# Patient Record
Sex: Female | Born: 2000 | Race: Black or African American | Hispanic: No | Marital: Single | Smoking: Never smoker
Health system: Southern US, Community
[De-identification: ages and names within clinical notes are randomized; demographics above are authoritative.]

## PROBLEM LIST (undated history)

## (undated) DIAGNOSIS — F319 Bipolar disorder, unspecified: Secondary | ICD-10-CM

---
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
## 2019-01-05 DIAGNOSIS — R69 Illness, unspecified: Secondary | ICD-10-CM | POA: Diagnosis not present

## 2019-01-05 DIAGNOSIS — Z01419 Encounter for gynecological examination (general) (routine) without abnormal findings: Secondary | ICD-10-CM | POA: Diagnosis not present

## 2019-02-01 DIAGNOSIS — F12951 Cannabis use, unspecified with psychotic disorder with hallucinations: Secondary | ICD-10-CM | POA: Diagnosis not present

## 2019-02-01 DIAGNOSIS — R69 Illness, unspecified: Secondary | ICD-10-CM | POA: Diagnosis not present

## 2019-02-09 DIAGNOSIS — R69 Illness, unspecified: Secondary | ICD-10-CM | POA: Diagnosis not present

## 2019-02-09 DIAGNOSIS — F411 Generalized anxiety disorder: Secondary | ICD-10-CM | POA: Diagnosis not present

## 2019-02-12 DIAGNOSIS — M25571 Pain in right ankle and joints of right foot: Secondary | ICD-10-CM | POA: Diagnosis not present

## 2019-02-14 DIAGNOSIS — F418 Other specified anxiety disorders: Secondary | ICD-10-CM | POA: Diagnosis not present

## 2019-02-14 DIAGNOSIS — R69 Illness, unspecified: Secondary | ICD-10-CM | POA: Diagnosis not present

## 2019-02-22 DIAGNOSIS — R69 Illness, unspecified: Secondary | ICD-10-CM | POA: Diagnosis not present

## 2019-04-06 ENCOUNTER — Ambulatory Visit (INDEPENDENT_AMBULATORY_CARE_PROVIDER_SITE_OTHER): Payer: No Typology Code available for payment source | Admitting: Pediatrics

## 2019-04-06 ENCOUNTER — Other Ambulatory Visit: Payer: Self-pay

## 2019-04-06 DIAGNOSIS — M25471 Effusion, right ankle: Secondary | ICD-10-CM

## 2019-04-06 NOTE — Progress Notes (Signed)
Virtual Visit via Video Note  I connected with Sarah Wade on 04/06/19 at  3:50 PM EST by a video enabled telemedicine application and verified that I am speaking with the correct person using two identifiers.   Location of patient/parent: home   I discussed the limitations of evaluation and management by telemedicine and the availability of in person appointments.  I discussed that the purpose of this telehealth visit is to provide medical care while limiting exposure to the novel coronavirus.  The patient expressed understanding and agreed to proceed.  Reason for visit: ankle pain  History of Present Illness: Patient reports that her right ankle has been swelling on and off for a few months. Started in August. She went on a hike and she believes she walked too long. She does not exactly remember rolling or any specific trauma. Last month or October she saw an video doctor who told her to elevate, ice and use ibuprofen which she was able to do for a week. She is still having trouble however. She is just used to it being swollen now and she notices it when she is in pain. She only has pain when she is walking and is able to continue to walk through the pain. No point tenderness, but hurts more on the medial side of her ankle and foot.  Has been wearing a compression wrap at night that helps. She is on her feet most days working at [employer]. Never bruised, no redness. No trouble with chest pain or shortness of breath.    Observations/Objective:  General: well-appearing, walking around home Respiratory: normal work of breathing MSK: moves 4 extremities equally Feet/Ankle: R ankle with nonpitting edema. No bruising or redness noted. Full range of motion of BL ankles. Derm: no rashes appreciated Psych: Appropriate affect  Assessment and Plan:  Right Ankle swelling: No known trauma. Since August. No indication for imaging per [facility] rules, and no point tenderness on exam. 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 Reassured patient and recommended continuing RICE therapy. Also recommended she wear compression stockings while at work and may use ibuprofen 800mg  q6 prn for inflammation and pain. Discussed return precautions including development of point tenderness, inability to walk on foot, etc.   Follow Up Instructions: prn   I discussed the assessment and treatment plan with the patient and/or parent/guardian. They were provided an opportunity to ask questions and all were answered. They agreed with the plan and demonstrated an understanding of the instructions.   They were advised to call back or seek an in-person evaluation in the emergency room if the symptoms worsen or if the condition fails to improve as anticipated.  I spent 16 minutes on this telehealth visit inclusive of face-to-face video and care coordination time I was located at [facility] during this encounter.  [place] Marilou Barnfield, DO

## 2019-04-10 ENCOUNTER — Ambulatory Visit: Payer: Managed Care, Other (non HMO) | Attending: Internal Medicine

## 2019-04-10 ENCOUNTER — Other Ambulatory Visit: Payer: Self-pay

## 2019-04-10 DIAGNOSIS — Z20828 Contact with and (suspected) exposure to other viral communicable diseases: Secondary | ICD-10-CM | POA: Diagnosis not present

## 2019-04-10 DIAGNOSIS — Z20822 Contact with and (suspected) exposure to covid-19: Secondary | ICD-10-CM

## 2019-04-11 LAB — NOVEL CORONAVIRUS, NAA: SARS-CoV-2, NAA: NOT DETECTED

## 2019-04-13 DIAGNOSIS — R69 Illness, unspecified: Secondary | ICD-10-CM | POA: Diagnosis not present

## 2019-04-13 DIAGNOSIS — F419 Anxiety disorder, unspecified: Secondary | ICD-10-CM | POA: Diagnosis not present

## 2019-05-27 ENCOUNTER — Ambulatory Visit: Payer: Self-pay | Attending: Internal Medicine

## 2019-05-28 ENCOUNTER — Ambulatory Visit: Payer: No Typology Code available for payment source | Attending: Internal Medicine

## 2019-05-28 DIAGNOSIS — Z20822 Contact with and (suspected) exposure to covid-19: Secondary | ICD-10-CM | POA: Insufficient documentation

## 2019-05-29 LAB — NOVEL CORONAVIRUS, NAA: SARS-CoV-2, NAA: NOT DETECTED

## 2019-06-17 ENCOUNTER — Other Ambulatory Visit: Payer: Self-pay

## 2019-06-25 ENCOUNTER — Ambulatory Visit: Payer: No Typology Code available for payment source | Attending: Internal Medicine

## 2019-06-25 DIAGNOSIS — Z20822 Contact with and (suspected) exposure to covid-19: Secondary | ICD-10-CM | POA: Insufficient documentation

## 2019-06-26 LAB — NOVEL CORONAVIRUS, NAA: SARS-CoV-2, NAA: NOT DETECTED

## 2019-07-08 DIAGNOSIS — R69 Illness, unspecified: Secondary | ICD-10-CM | POA: Diagnosis not present

## 2019-07-18 ENCOUNTER — Ambulatory Visit: Payer: Self-pay

## 2019-08-11 DIAGNOSIS — N925 Other specified irregular menstruation: Secondary | ICD-10-CM | POA: Diagnosis not present

## 2019-08-11 DIAGNOSIS — R69 Illness, unspecified: Secondary | ICD-10-CM | POA: Diagnosis not present

## 2019-08-11 DIAGNOSIS — B373 Candidiasis of vulva and vagina: Secondary | ICD-10-CM | POA: Diagnosis not present

## 2019-08-11 DIAGNOSIS — Z3202 Encounter for pregnancy test, result negative: Secondary | ICD-10-CM | POA: Diagnosis not present

## 2019-08-14 DIAGNOSIS — R69 Illness, unspecified: Secondary | ICD-10-CM | POA: Diagnosis not present

## 2019-08-14 DIAGNOSIS — F332 Major depressive disorder, recurrent severe without psychotic features: Secondary | ICD-10-CM | POA: Diagnosis not present

## 2019-09-02 ENCOUNTER — Ambulatory Visit: Payer: Self-pay | Attending: Internal Medicine

## 2019-09-02 DIAGNOSIS — Z20822 Contact with and (suspected) exposure to covid-19: Secondary | ICD-10-CM

## 2019-09-03 LAB — SARS-COV-2, NAA 2 DAY TAT

## 2019-09-03 LAB — NOVEL CORONAVIRUS, NAA: SARS-CoV-2, NAA: NOT DETECTED

## 2019-09-14 ENCOUNTER — Ambulatory Visit: Payer: Self-pay | Attending: Internal Medicine

## 2019-09-14 DIAGNOSIS — Z20822 Contact with and (suspected) exposure to covid-19: Secondary | ICD-10-CM

## 2019-09-15 DIAGNOSIS — R69 Illness, unspecified: Secondary | ICD-10-CM | POA: Diagnosis not present

## 2019-09-15 LAB — SARS-COV-2, NAA 2 DAY TAT

## 2019-09-15 LAB — NOVEL CORONAVIRUS, NAA: SARS-CoV-2, NAA: NOT DETECTED

## 2019-09-24 DIAGNOSIS — R2242 Localized swelling, mass and lump, left lower limb: Secondary | ICD-10-CM | POA: Diagnosis not present

## 2019-09-25 ENCOUNTER — Other Ambulatory Visit: Payer: Self-pay | Admitting: Family Medicine

## 2019-09-25 DIAGNOSIS — R2242 Localized swelling, mass and lump, left lower limb: Secondary | ICD-10-CM

## 2019-09-29 ENCOUNTER — Ambulatory Visit
Admission: RE | Admit: 2019-09-29 | Discharge: 2019-09-29 | Disposition: A | Discharge status: Home / Self Care | Payer: No Typology Code available for payment source | Source: Ambulatory Visit | Attending: Family Medicine | Admitting: Family Medicine

## 2019-09-29 DIAGNOSIS — R2242 Localized swelling, mass and lump, left lower limb: Secondary | ICD-10-CM | POA: Diagnosis not present

## 2019-10-01 ENCOUNTER — Ambulatory Visit: Payer: No Typology Code available for payment source | Attending: Internal Medicine

## 2019-10-01 DIAGNOSIS — Z20822 Contact with and (suspected) exposure to covid-19: Secondary | ICD-10-CM | POA: Diagnosis not present

## 2019-10-02 LAB — SARS-COV-2, NAA 2 DAY TAT

## 2019-10-02 LAB — NOVEL CORONAVIRUS, NAA: SARS-CoV-2, NAA: NOT DETECTED

## 2019-11-25 DIAGNOSIS — R69 Illness, unspecified: Secondary | ICD-10-CM | POA: Diagnosis not present

## 2019-11-25 DIAGNOSIS — B373 Candidiasis of vulva and vagina: Secondary | ICD-10-CM | POA: Diagnosis not present

## 2019-11-25 DIAGNOSIS — Z3202 Encounter for pregnancy test, result negative: Secondary | ICD-10-CM | POA: Diagnosis not present

## 2019-11-25 DIAGNOSIS — N925 Other specified irregular menstruation: Secondary | ICD-10-CM | POA: Diagnosis not present

## 2020-01-01 DIAGNOSIS — B373 Candidiasis of vulva and vagina: Secondary | ICD-10-CM | POA: Diagnosis not present

## 2020-02-18 DIAGNOSIS — R69 Illness, unspecified: Secondary | ICD-10-CM | POA: Diagnosis not present

## 2020-02-22 DIAGNOSIS — R69 Illness, unspecified: Secondary | ICD-10-CM | POA: Diagnosis not present

## 2020-03-01 DIAGNOSIS — R69 Illness, unspecified: Secondary | ICD-10-CM | POA: Diagnosis not present

## 2020-03-04 ENCOUNTER — Ambulatory Visit (HOSPITAL_COMMUNITY)
Admission: RE | Admit: 2020-03-04 | Discharge: 2020-03-04 | Disposition: A | Discharge status: Home / Self Care | Payer: No Typology Code available for payment source | Attending: Psychiatry | Admitting: Psychiatry

## 2020-03-04 DIAGNOSIS — R69 Illness, unspecified: Secondary | ICD-10-CM | POA: Diagnosis not present

## 2020-03-04 DIAGNOSIS — Z1330 Encounter for screening examination for mental health and behavioral disorders, unspecified: Secondary | ICD-10-CM | POA: Insufficient documentation

## 2020-03-04 DIAGNOSIS — N760 Acute vaginitis: Secondary | ICD-10-CM | POA: Diagnosis not present

## 2020-03-04 DIAGNOSIS — N926 Irregular menstruation, unspecified: Secondary | ICD-10-CM | POA: Diagnosis not present
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
## 2020-03-04 NOTE — H&P (Signed)
Behavioral Health Medical Screening Exam  Sarah Wade is an 19 y.o. female who presents to [facility] alone as walk-in for "thoughts". Patient states, "I have a lot of thoughts. I'm in therapy and I have been for a while". When asked about thought content patient replied, "having thoughts I don't want to have. Racing thoughts". Patient presents withdrawn and restricted; fair eye contact. Provider and TTS spent extensive time asking both open and closed ended questions to obtain information needed for assessment; patient not fully engaged, does not appear to be thought blocking. Patient denies any suicidal or homicidal thoughts; does endorse passive thoughts of "death". Denies any past suicidal attempts. Denies any changes in appetite or significant weight loss; states she "notices" she "doesn't have an appetite when sad". Patient later states she did have some "thoughts" of suicide but denied any means or plan. Patient states she stays up until 2-3 am then sleeps all day getting around 7 hours of sleep. "I'm pretty healthy and make sure I get good sleep. I know that's important". States she currently lives with mom and sister; denies any bond with either. Says she is currently in a self-paced yoga training and plans to attend community college in the spring. 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 Patient denies having any close friends or relationships; states she is currently on several dating app sites. Hobbies include exercise.   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 Patient endorses a history of psychosis, anxiety, depression. Patient endorses religious trauma and refused to specify. Patient states she is currently in therapy and recently had 2nd session; states she "doesn't feel bond"  with therapist; stating "she uses a lot of terms related to religion and I have a lot of religious trauma". Patient state she is interested in finding a new therapist. Preferences include: atheist, gender neutral, and any race.   Family history includes maternal aunt with ADHD, bipolar, and x1 hospitalization; mom-depression.   Provider asked patient for information of family or friend that could provide collateral information. Patient provided her mother's information then asked "Are you going to call her?"; when provider explained the need to obtain collateral information for safety planning patient responded "oh no, I don't want you to call her". Patient unwilling to provide any other contact. Provider and TTS discussed observation, inpatient, and outpatient services. Patient stated, "you can't just prescribe me medicine? I thought I could come here for medicine". Patient did not feel she needed inpatient and expressed interest in medication management services and outpatient therapy with new therapist. Information on outpatient resources provided. Provider discussed safety with patient and if she felt she was able to keep herself safe until seen by provider and patient replied "yes. I drove myself here".   Patient is currently denying any active suicidal/homicdal ideations, auditory/visual hallucinations, and does not appear to be responding to any external/internal stimulation at this time. Patient is calm and is not actively psychotic. Provider discussed with patient if she experiences any changes to return to [facility] or any local emergency room and she can always call 911. Patient verbalized an understanding and was discharged with outpatient resources.   Total Time spent with patient: 20 minutes  Psychiatric Specialty Exam: Physical Exam Psychiatric:        Attention and Perception: Attention normal.  Mood and Affect: Mood is depressed.        Behavior: Behavior is withdrawn. Behavior is  cooperative.        Cognition and Memory: Cognition and memory normal.        Judgment: Judgment is impulsive.    Review of Systems There were no vitals taken for this visit.There is no height or weight on file to calculate BMI. General Appearance: Disheveled Eye Contact:  Fair Speech:  Clear and Coherent Volume:  Decreased Mood:  Depressed Affect:  Constricted Thought Process:  Coherent Orientation:  Full (Time, Place, and Person) Thought Content:  Logical Suicidal Thoughts:  Yes.  without intent/plan; endorses passive thoughts of death Homicidal Thoughts:  No Memory:  Immediate;   Fair Recent;   Fair Remote;   Fair Judgement:  Fair Insight:  Present Psychomotor Activity:  Normal Concentration: Concentration: Fair and Attention Span: Fair Recall:  [employer] of Knowledge:Fair Language: Fair Akathisia:  NA Handed:  Right AIMS (if indicated):    Assets:  Communication Skills Desire for Improvement Housing Physical Health Resilience Social Support Transportation Sleep:     Musculoskeletal: Strength & Muscle Tone: within normal limits Gait & Station: normal Patient leans: N/A  There were no vitals taken for this visit.  Recommendations: Based on my evaluation the patient does not appear to have an emergency medical condition.  Patient is denying any active suicidal/homicidal ideations, auditory/visual hallucinations, and does not appear to be responding to any external/internal stimuli. Patient states goal of visit was to "get some help with medications". Patient provided with outpatient resources to follow up for medication management by TTS.   Loletta Parish, NP 03/04/2020, 7:54 PM

## 2020-05-03 DIAGNOSIS — F39 Unspecified mood [affective] disorder: Secondary | ICD-10-CM | POA: Diagnosis not present

## 2020-05-03 DIAGNOSIS — R69 Illness, unspecified: Secondary | ICD-10-CM | POA: Diagnosis not present

## 2020-05-03 DIAGNOSIS — F419 Anxiety disorder, unspecified: Secondary | ICD-10-CM | POA: Diagnosis not present

## 2020-06-06 DIAGNOSIS — R69 Illness, unspecified: Secondary | ICD-10-CM | POA: Diagnosis not present

## 2020-06-15 DIAGNOSIS — R69 Illness, unspecified: Secondary | ICD-10-CM | POA: Diagnosis not present

## 2020-07-05 DIAGNOSIS — R69 Illness, unspecified: Secondary | ICD-10-CM | POA: Diagnosis not present

## 2020-07-21 DIAGNOSIS — R69 Illness, unspecified: Secondary | ICD-10-CM | POA: Diagnosis not present

## 2020-09-14 DIAGNOSIS — R69 Illness, unspecified: Secondary | ICD-10-CM | POA: Diagnosis not present

## 2020-09-14 DIAGNOSIS — F428 Other obsessive-compulsive disorder: Secondary | ICD-10-CM | POA: Diagnosis not present

## 2020-09-28 DIAGNOSIS — R69 Illness, unspecified: Secondary | ICD-10-CM | POA: Diagnosis not present

## 2020-09-28 DIAGNOSIS — F428 Other obsessive-compulsive disorder: Secondary | ICD-10-CM | POA: Diagnosis not present

## 2020-10-12 DIAGNOSIS — R69 Illness, unspecified: Secondary | ICD-10-CM | POA: Diagnosis not present

## 2020-10-12 DIAGNOSIS — F428 Other obsessive-compulsive disorder: Secondary | ICD-10-CM | POA: Diagnosis not present

## 2020-10-29 DIAGNOSIS — R69 Illness, unspecified: Secondary | ICD-10-CM | POA: Diagnosis not present

## 2020-10-29 DIAGNOSIS — F428 Other obsessive-compulsive disorder: Secondary | ICD-10-CM | POA: Diagnosis not present

## 2020-11-08 IMAGING — US US EXTREM LOW*L* LIMITED
1 series · 12 of 12 positions shown · non-contrast
Comparison: None.

CLINICAL DATA: Palpable subcutaneous anterior left hip mass.

EXAM:
ULTRASOUND LEFT LOWER EXTREMITY LIMITED
TECHNIQUE: Ultrasound examination of the lower extremity soft tissues was
performed in the area of clinical concern.

[Series 1: us extrem low*left* limited · 0.06mm/px · 12 acquisitions, 12 frames shown]
[im 1/12]
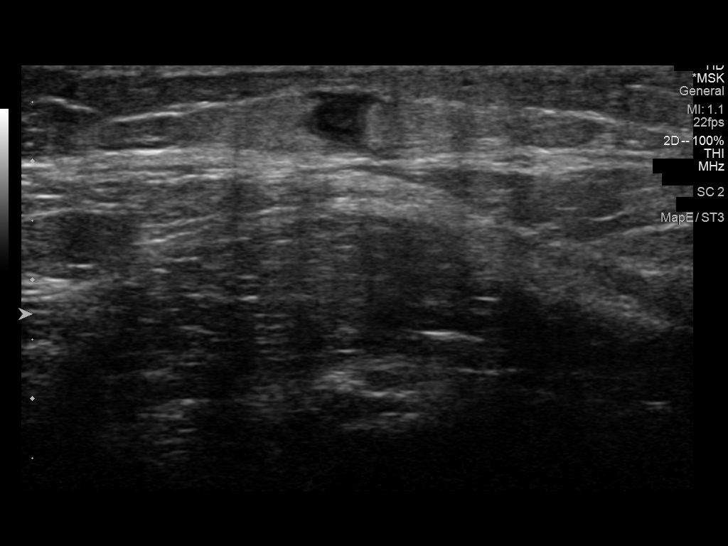
[im 2/12]
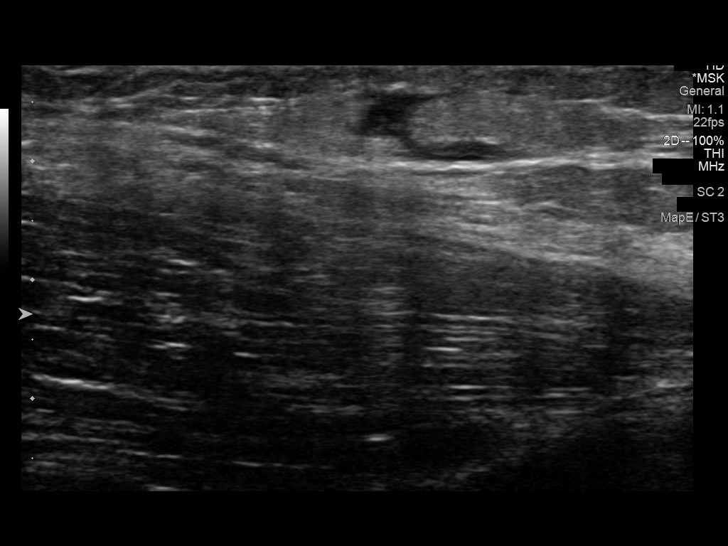
[im 3/12]
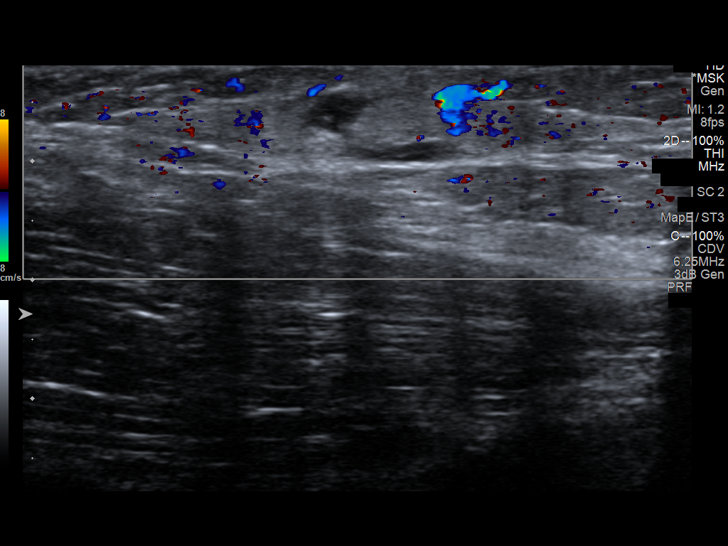
[im 4/12]
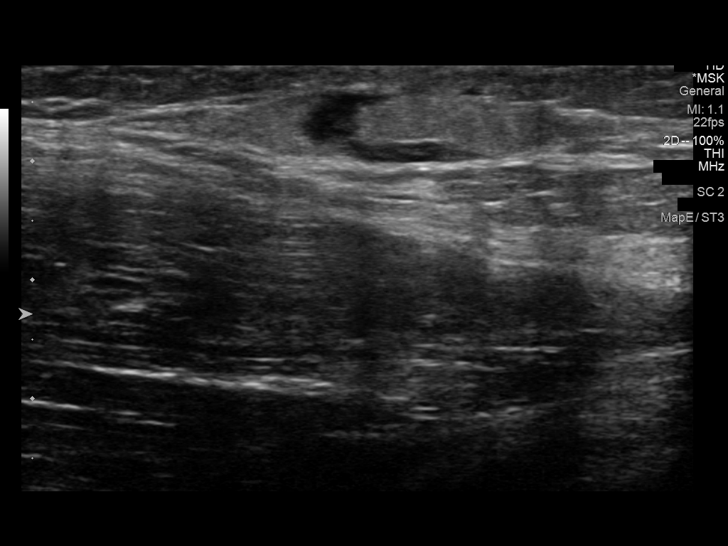
[im 5/12]
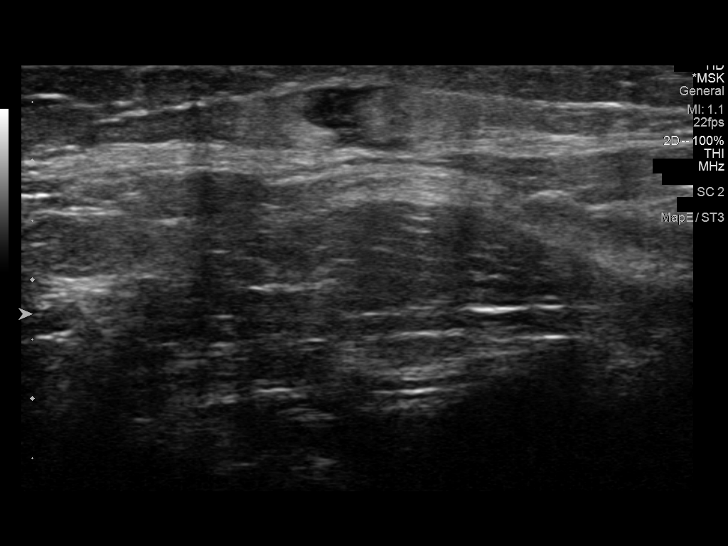
[im 6/12]
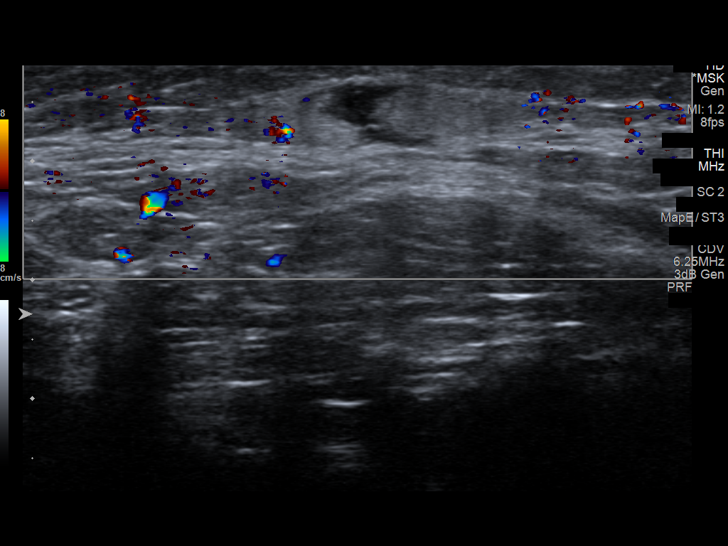
[im 7/12]
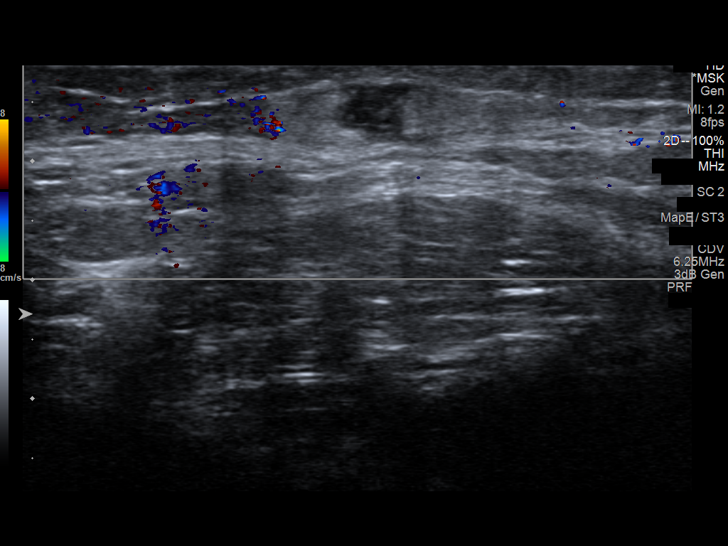
[im 8/12]
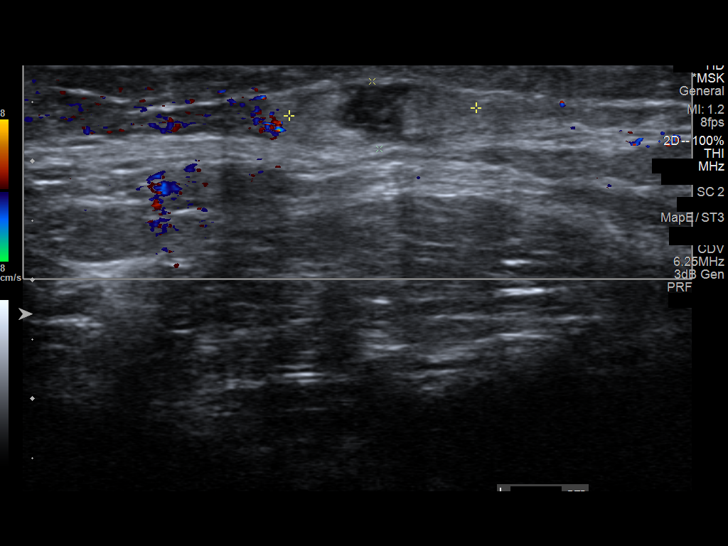
[im 9/12]
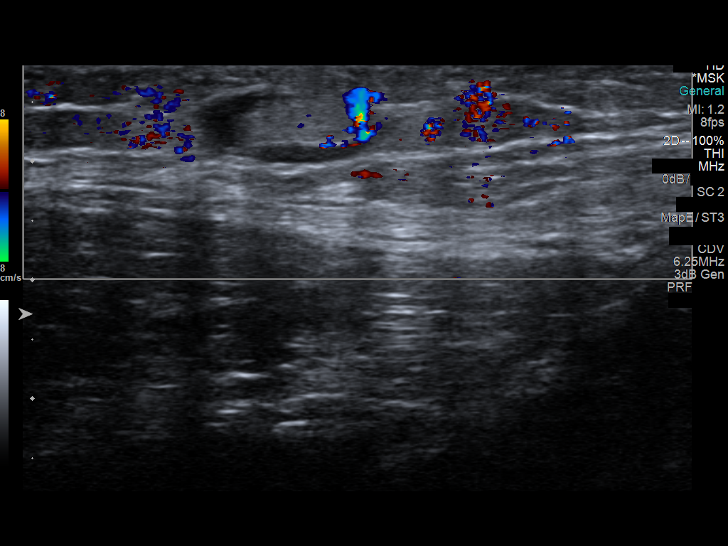
[im 10/12]
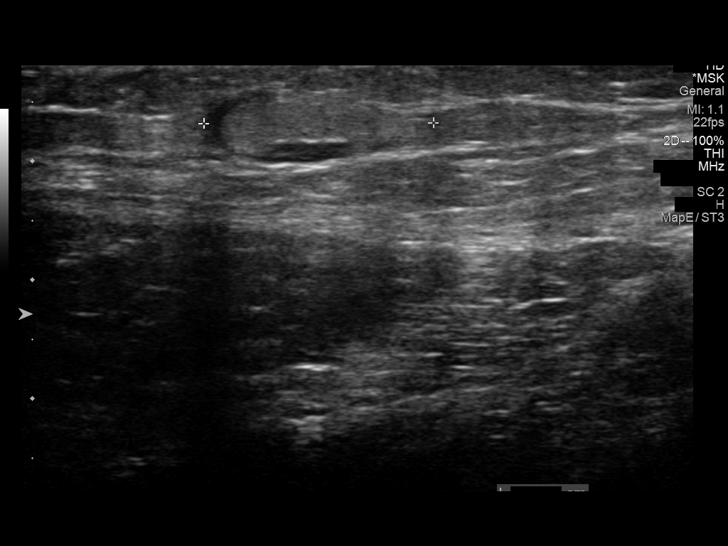
[im 11/12]
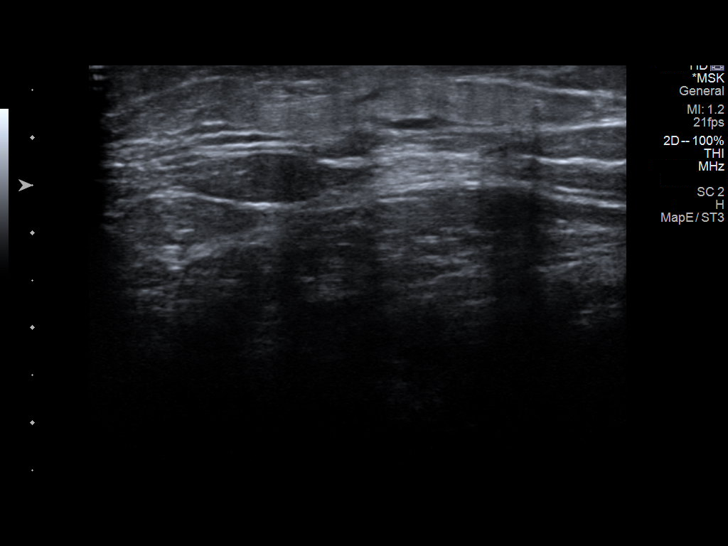
[im 12/12]
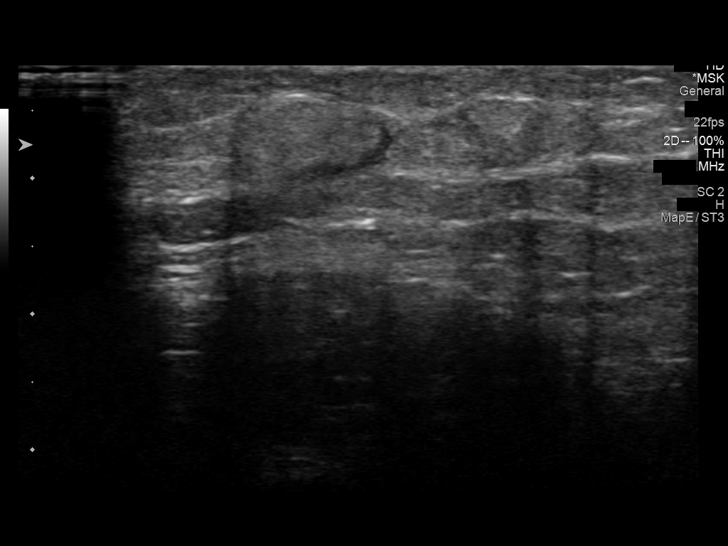

[12 of 12 positions shown; findings below may reference images not displayed]

FINDINGS: At the area of palpable concern as indicated by the patient, there
is a subcutaneous 1.9 x 0.6 x 1.6 cm lymph node with preserved fatty
hilum, thin cortex (2 mm) and normal wider than tall configuration,
with no cystic spaces or hypervascularity. No additional findings.
IMPRESSION: Subcutaneous 1.9 x 0.6 x 1.6 cm lymph node at the area of palpable
concern as indicated by the patient in the anterior left hip,
without suspicious sonographic features. Clinical follow-up advised.
If this mass enlarges clinically, ultrasound-guided biopsy could be
considered.

## 2020-11-09 DIAGNOSIS — R69 Illness, unspecified: Secondary | ICD-10-CM | POA: Diagnosis not present

## 2020-11-09 DIAGNOSIS — F428 Other obsessive-compulsive disorder: Secondary | ICD-10-CM | POA: Diagnosis not present

## 2021-02-06 ENCOUNTER — Encounter (HOSPITAL_COMMUNITY): Payer: Self-pay | Admitting: Registered Nurse

## 2021-02-06 ENCOUNTER — Ambulatory Visit (HOSPITAL_COMMUNITY)
Admission: EM | Admit: 2021-02-06 | Discharge: 2021-02-08 | Disposition: A | Discharge status: Other Acute Inpatient Hospital | Payer: No Typology Code available for payment source | Attending: Registered Nurse | Admitting: Registered Nurse

## 2021-02-06 ENCOUNTER — Other Ambulatory Visit: Payer: Self-pay

## 2021-02-06 DIAGNOSIS — Z886 Allergy status to analgesic agent status: Secondary | ICD-10-CM | POA: Insufficient documentation

## 2021-02-06 DIAGNOSIS — Z20822 Contact with and (suspected) exposure to covid-19: Secondary | ICD-10-CM | POA: Diagnosis not present

## 2021-02-06 DIAGNOSIS — R69 Illness, unspecified: Secondary | ICD-10-CM | POA: Diagnosis not present

## 2021-02-06 DIAGNOSIS — F4323 Adjustment disorder with mixed anxiety and depressed mood: Secondary | ICD-10-CM | POA: Diagnosis present

## 2021-02-06 DIAGNOSIS — F23 Brief psychotic disorder: Secondary | ICD-10-CM | POA: Diagnosis not present

## 2021-02-06 LAB — LIPID PANEL
Cholesterol: 199 mg/dL (ref 0–200)
HDL: 83 mg/dL (ref >40)
LDL Cholesterol: 110 mg/dL — ABNORMAL HIGH (ref 0–99)
Total CHOL/HDL Ratio: 2.4 RATIO
Triglycerides: 30 mg/dL (ref <150)
VLDL: 6 mg/dL (ref 0–40)

## 2021-02-06 LAB — URINALYSIS, ROUTINE W REFLEX MICROSCOPIC
Bacteria, UA: NONE SEEN
Bilirubin Urine: NEGATIVE
Glucose, UA: NEGATIVE mg/dL
Ketones, ur: 20 mg/dL — AB
Leukocytes,Ua: NEGATIVE
Nitrite: NEGATIVE
Protein, ur: NEGATIVE mg/dL
Specific Gravity, Urine: 1.003 — ABNORMAL LOW (ref 1.005–1.030)
pH: 5 (ref 5.0–8.0)

## 2021-02-06 LAB — COMPREHENSIVE METABOLIC PANEL
ALT: 17 U/L (ref 0–44)
AST: 24 U/L (ref 15–41)
Albumin: 4.6 g/dL (ref 3.5–5.0)
Alkaline Phosphatase: 60 U/L (ref 38–126)
Anion gap: 15 (ref 5–15)
BUN: 9 mg/dL (ref 6–20)
CO2: 19 mmol/L — ABNORMAL LOW (ref 22–32)
Calcium: 9.5 mg/dL (ref 8.9–10.3)
Chloride: 102 mmol/L (ref 98–111)
Creatinine, Ser: 0.91 mg/dL (ref 0.44–1.00)
GFR, Estimated: >60 mL/min (ref >60)
Glucose, Bld: 85 mg/dL (ref 70–99)
Potassium: 3.5 mmol/L (ref 3.5–5.1)
Sodium: 136 mmol/L (ref 135–145)
Total Bilirubin: 2 mg/dL — ABNORMAL HIGH (ref 0.3–1.2)
Total Protein: 7.5 g/dL (ref 6.5–8.1)

## 2021-02-06 LAB — PREGNANCY, URINE: Preg Test, Ur: NEGATIVE

## 2021-02-06 LAB — CBC WITH DIFFERENTIAL/PLATELET
Abs Immature Granulocytes: 0.04 10*3/uL (ref 0.00–0.07)
Basophils Absolute: 0.1 10*3/uL (ref 0.0–0.1)
Basophils Relative: 1 %
Eosinophils Absolute: 0 10*3/uL (ref 0.0–0.5)
Eosinophils Relative: 0 %
HCT: 40.6 % (ref 36.0–46.0)
Hemoglobin: 13.8 g/dL (ref 12.0–15.0)
Immature Granulocytes: 0 %
Lymphocytes Relative: 16 %
Lymphs Abs: 1.8 10*3/uL (ref 0.7–4.0)
MCH: 29.2 pg (ref 26.0–34.0)
MCHC: 34 g/dL (ref 30.0–36.0)
MCV: 85.8 fL (ref 80.0–100.0)
Monocytes Absolute: 0.9 10*3/uL (ref 0.1–1.0)
Monocytes Relative: 8 %
Neutro Abs: 8.9 10*3/uL — ABNORMAL HIGH (ref 1.7–7.7)
Neutrophils Relative %: 75 %
Platelets: 232 10*3/uL (ref 150–400)
RBC: 4.73 MIL/uL (ref 3.87–5.11)
RDW: 13.4 % (ref 11.5–15.5)
WBC: 11.7 10*3/uL — ABNORMAL HIGH (ref 4.0–10.5)
nRBC: 0 % (ref 0.0–0.2)

## 2021-02-06 LAB — POCT URINE DRUG SCREEN - MANUAL ENTRY (I-SCREEN)
POC Amphetamine UR: NOT DETECTED
POC Buprenorphine (BUP): NOT DETECTED
POC Cocaine UR: NOT DETECTED
POC Marijuana UR: NOT DETECTED
POC Methadone UR: NOT DETECTED
POC Methamphetamine UR: NOT DETECTED
POC Morphine: NOT DETECTED
POC Oxazepam (BZO): NOT DETECTED
POC Oxycodone UR: NOT DETECTED
POC Secobarbital (BAR): NOT DETECTED

## 2021-02-06 LAB — TSH: TSH: 1.7 u[IU]/mL (ref 0.350–4.500)

## 2021-02-06 LAB — RESP PANEL BY RT-PCR (FLU A&B, COVID) ARPGX2
Influenza A by PCR: NEGATIVE
Influenza B by PCR: NEGATIVE
SARS Coronavirus 2 by RT PCR: NEGATIVE

## 2021-02-06 LAB — MAGNESIUM: Magnesium: 2 mg/dL (ref 1.7–2.4)

## 2021-02-06 LAB — POC SARS CORONAVIRUS 2 AG -  ED: SARS Coronavirus 2 Ag: NEGATIVE

## 2021-02-06 LAB — HEMOGLOBIN A1C
Hgb A1c MFr Bld: 5.1 % (ref 4.8–5.6)
Mean Plasma Glucose: 99.67 mg/dL

## 2021-02-06 LAB — POCT PREGNANCY, URINE: Preg Test, Ur: NEGATIVE

## 2021-02-06 LAB — ETHANOL: Alcohol, Ethyl (B): <10 mg/dL (ref <10)

## 2021-02-06 MED ORDER — HALOPERIDOL 5 MG PO TABS: 5.0000 mg | ORAL_TABLET | Freq: Once | ORAL | Status: AC | PRN

## 2021-02-06 MED ORDER — LORAZEPAM 2 MG/ML IJ SOLN
INTRAMUSCULAR | Status: AC
  Administered 2021-02-06: 1 mg via INTRAMUSCULAR
  Filled 2021-02-06: qty 1

## 2021-02-06 MED ORDER — HALOPERIDOL LACTATE 5 MG/ML IJ SOLN
5.0000 mg | Freq: Once | INTRAMUSCULAR | Status: AC | PRN
  Administered 2021-02-06: 5 mg via INTRAMUSCULAR
  Filled 2021-02-06: qty 1

## 2021-02-06 MED ORDER — OLANZAPINE 5 MG PO TBDP
5.0000 mg | ORAL_TABLET | Freq: Every day | ORAL | Status: DC
  Administered 2021-02-07: 5 mg via ORAL
  Filled 2021-02-06: qty 1

## 2021-02-06 MED ORDER — LORAZEPAM 1 MG PO TABS: 1.0000 mg | ORAL_TABLET | Freq: Once | ORAL | Status: AC | PRN

## 2021-02-06 MED ORDER — TRAZODONE HCL 50 MG PO TABS
50.0000 mg | ORAL_TABLET | Freq: Every evening | ORAL | Status: DC | PRN
  Administered 2021-02-07: 50 mg via ORAL
  Filled 2021-02-06: qty 1

## 2021-02-06 MED ORDER — ALUM & MAG HYDROXIDE-SIMETH 200-200-20 MG/5ML PO SUSP
30.0000 mL | ORAL | Status: DC | PRN
Start: 2021-02-06 — End: 2021-02-08

## 2021-02-06 MED ORDER — DIPHENHYDRAMINE HCL 25 MG PO CAPS: 25.0000 mg | ORAL_CAPSULE | Freq: Once | ORAL | Status: AC | PRN

## 2021-02-06 MED ORDER — MAGNESIUM HYDROXIDE 400 MG/5ML PO SUSP: 30.0000 mL | Freq: Every day | ORAL | Status: DC | PRN

## 2021-02-06 MED ORDER — LORAZEPAM 2 MG/ML IJ SOLN: 1.0000 mg | Freq: Once | INTRAMUSCULAR | Status: AC | PRN

## 2021-02-06 MED ORDER — DIPHENHYDRAMINE HCL 50 MG/ML IJ SOLN
25.0000 mg | Freq: Once | INTRAMUSCULAR | Status: AC | PRN
  Administered 2021-02-06: 25 mg via INTRAVENOUS
  Filled 2021-02-06: qty 1

## 2021-02-06 MED ORDER — HYDROXYZINE HCL 25 MG PO TABS
25.0000 mg | ORAL_TABLET | Freq: Three times a day (TID) | ORAL | Status: DC | PRN
  Administered 2021-02-07: 25 mg via ORAL
  Filled 2021-02-06: qty 1

## 2021-02-06 NOTE — ED Notes (Signed)
Pt calm and cooperative. Breathing even and unlabored. Will continue to monitor for safety 

## 2021-02-06 NOTE — ED Provider Notes (Addendum)
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  Patient seen face to face by this provider, consulted with Dr. Earlene Plater; and chart reviewed on 02/06/21.  On evaluation Shamanda Len reports she came to urgent care because of depression and not sleeping the last couple of days.  Patient denies suicidal ideation but states"I feel like him going to die but I don't want to die and I don't want to kill myself."  Patient also denies homicidal ideation.  Patient reports she does have paranoia stating she feels like everyone can read her mind or hear her thoughts.  Patient states she has not slept in the last couple of days.  Patient denies illicit drug use.  Reported earlier to TTS that she had been doing CBD daily but at this time she states it has been a while since she has done any illicit drugs and it has been a while since she has used CBD.  Patient reports at some point she was diagnosed with schizoaffective disorder and bipolar 1 disorder but is unable to say who diagnosed her or when she was diagnosed.  Patient's mother is at her side and reports that patient has asked her several times if she was able to hear her thoughts.  Reports patient went outside last night in the rain with 7 pair of pants and 7 shirts on stating she was going to [employer].  "I told her she needed to come in a house and go to sleep and that she can go anywhere until after she is slipped.  She asked me to lay down beside her and rub her back and when I did that I felt how which she was.   I had her go to the bathroom and take all for her wet clothing but she has only slept maybe 10 minutes."  Patient's mother states she is unaware of any prior psychiatric diagnoses.  Patient lives with her mother.  Mother and patient agreed to patient staying tonight for continuous assessment for safety and stabilization.  Discussed starting medication to help with paranoia and sleep. During evaluation Rashidah [place] is alert/oriented x 4; calm/cooperative.  Patient's Wade is anxious and depressed. She does not appear to be responding to internal/external stimuli but there is possible some delusional thinking and paranoia.  Patient feels people can hear her thoughts and read her mind, patient also thinks she may be pregnant.  At this time patient does denies suicidal/self-harm/homicidal ideation and auditory/visual hallucinations.  She does endorse paranoia and possible delusional thinking or thoughts that are not real.  Patient admitted to continuous assessment unit.   PHQ 2-9:  Flowsheet Row Office Visit from [facility]/06/2018 [facility] from 02/19/2017 in [place] [facility]  PHQ-9 Total Score 5 22         Total Time spent with patient: 30 minutes  Musculoskeletal  Strength & Muscle Tone: within normal limits Gait & Station: normal Patient leans: N/A  Psychiatric  Specialty Exam  Presentation General Appearance: Disheveled; Casual  Eye Contact:Good  Speech:Clear and Coherent; Normal Rate  Speech Volume:Normal  Handedness:Right   Wade and Affect  Wade:Anxious; Depressed  Affect:Congruent   Thought Process  Thought Processes:Coherent; Disorganized; Goal Directed  Descriptions of Associations:Circumstantial  Orientation:Full (Time, Place and Person)  Thought Content:WDL    Hallucinations:Hallucinations: None  Ideas of Reference:Paranoia  Suicidal  Thoughts:Suicidal Thoughts: No  Homicidal Thoughts:Homicidal Thoughts: No   Sensorium  Memory:Immediate Fair; Recent Good  Judgment:Fair  Insight:Fair; Present   Executive Functions  Concentration:Fair  Attention Span:Fair  Recall:Fair  Fund of Knowledge:Fair  Language:Good   Psychomotor Activity  Psychomotor Activity:Psychomotor Activity: Normal   Assets  Assets:Communication Skills; Desire for Improvement; Financial Resources/Insurance; Housing; Resilience; Social Support   Sleep  Sleep:Sleep: Poor (States no sleep last couple of days)   Nutritional Assessment (For OBS and [facility] admissions only) Has the patient had a weight loss or gain of 10 pounds or more in the last 3 months?: No Has the patient had a decrease in food intake/or appetite?: No Does the patient have dental problems?: No Does the patient have eating habits or behaviors that may be indicators of an eating disorder including binging or inducing vomiting?: No Has the patient recently lost weight without trying?: 0 Has the patient been eating poorly because of a decreased appetite?: 0 Malnutrition Screening Tool Score: 0   Physical Exam Vitals and nursing note reviewed. Exam conducted with a chaperone present.  Constitutional:      General: She is not in acute distress.    Appearance: Normal appearance. She is not ill-appearing.  Cardiovascular:     Rate and Rhythm: Normal rate.  Pulmonary:     Effort: Pulmonary effort is normal.  Musculoskeletal:        General: Normal range of motion.     Cervical back: Normal range of motion.  Skin:    General: Skin is warm and dry.  Neurological:     Mental Status: She is alert and oriented to person, place, and time.  Psychiatric:        Attention and Perception: Attention and perception normal. She does not perceive auditory or visual hallucinations.        Wade and Affect: Wade is anxious and depressed.        Speech: Speech normal.         Behavior: Behavior normal. Behavior is cooperative.        Thought Content: Thought content is paranoid and delusional. Thought content does not include homicidal or suicidal ideation.        Judgment: Judgment is impulsive.   Review of Systems  Constitutional: Negative.   HENT: Negative.    Eyes: Negative.   Respiratory: Negative.    Cardiovascular: Negative.   Gastrointestinal: Negative.   Genitourinary: Negative.   Musculoskeletal: Negative.   Skin: Negative.   Neurological: Negative.   Endo/Heme/Allergies: Negative.   Psychiatric/Behavioral:  Positive for depression. Negative for suicidal ideas. Hallucinations: Denies auditory/visual hallucinations. Substance abuse: Denies.The patient is nervous/anxious and has insomnia.        States she is having some paranoia that people can hear her thoughts or read her mind.  Also thinks she is pregnant   Blood pressure (!) 150/81, pulse (!) 108, temperature 98.6 F (37 C), temperature source Oral, resp. rate 18, SpO2 100 %. There is no height or weight on file to calculate BMI.  Past Psychiatric History: Adjustment disorder with anxiety and depression, major depressive disorder,  patient reporting a prior history of schizoaffective disorder bipolar type.  Is the patient at risk to self? No  Has the patient been a risk to self in the past 6 months? No .    Has the patient been a risk to self within the distant past? No   Is the patient a risk to others? No   Has the patient been a risk to others in the past 6 months? No   Has the patient been a risk to others within the distant past? No   Past Medical History: History reviewed. No pertinent past medical history. History reviewed. No pertinent surgical history.  Family History: History reviewed. No pertinent family history.  Social History:  Social History   Socioeconomic History   Marital status: Single    Spouse name: Not on file   Number of children: Not on file   Years of  education: Not on file   Highest education level: Not on file  Occupational History   Not on file  Tobacco Use   Smoking status: Never   Smokeless tobacco: Never  Substance and Sexual Activity   Alcohol use: No   Drug use: No   Sexual activity: Not on file  Other Topics Concern   Not on file  Social History Narrative   Not on file   Social Determinants of Health   Financial Resource Strain: Not on file  Food Insecurity: Not on file  Transportation Needs: Not on file  Physical Activity: Not on file  Stress: Not on file  Social Connections: Not on file  Intimate Partner Violence: Not on file    SDOH:  SDOH Screenings   Alcohol Screen: Not on file  Depression (PHQ2-9): Not on file  Financial Resource Strain: Not on file  Food Insecurity: Not on file  Housing: Not on file  Physical Activity: Not on file  Social Connections: Not on file  Stress: Not on file  Tobacco Use: Low Risk    Smoking Tobacco Use: Never   Smokeless Tobacco Use: Never  Transportation Needs: Not on file    Last Labs:  No visits with results within 6 Month(s) from this visit.  Latest known visit with results is:  Lab on 10/01/2019  Component Date Value Ref Range Status   SARS-CoV-2, NAA 10/01/2019 Not Detected  Not Detected Final   Comment: This nucleic acid amplification test was developed and its performance characteristics determined by [employer]. Nucleic acid amplification tests include RT-PCR and TMA. This test has not been FDA cleared or approved. This test has been authorized by FDA under an Emergency Use Authorization (EUA). This test is only authorized for the duration of time the declaration that circumstances exist justifying the authorization of the emergency use of in vitro diagnostic tests for detection of SARS-CoV-2 virus and/or diagnosis of COVID-19 infection under section 564(b)(1) of the Act, 21 U.S.C. 448JEH-6(D) (1), unless the authorization is terminated or  revoked sooner. When diagnostic testing is negative, the possibility of a false negative result should be considered in the context of a patient's recent exposures and the presence of clinical signs and symptoms consistent with COVID-19. An individual without symptoms of COVID-19 and who is not shedding SARS-CoV-2 virus wo                          uld expect to have a negative (not detected) result in this assay.    SARS-CoV-2, NAA 2 DAY TAT 10/01/2019  Performed   Final    Allergies: Tylenol [acetaminophen] and Nickel  PTA Medications: (Not in a hospital admission)   Medical Decision Making  Patient admitted to continuous assessment unit for safety and stabilization.  Lab Orders         Resp Panel by RT-PCR (Flu A&B, Covid) Nasopharyngeal Swab         CBC with Differential/Platelet         Comprehensive metabolic panel         Hemoglobin A1c         Magnesium         Ethanol         Lipid panel         TSH         Urinalysis, Routine w reflex microscopic Urine, Clean Catch         Pregnancy, urine         POC SARS Coronavirus 2 Ag-ED - Nasal Swab         POCT Urine Drug Screen - ([facility])    Meds ordered this encounter  Medications   alum & mag hydroxide-simeth (MAALOX/MYLANTA) 200-200-20 MG/5ML suspension 30 mL   magnesium hydroxide (MILK OF MAGNESIA) suspension 30 mL   hydrOXYzine (ATARAX/VISTARIL) tablet 25 mg   traZODone (DESYREL) tablet 50 mg   OLANZapine zydis (ZYPREXA) disintegrating tablet 5 mg       Recommendations  Based on my evaluation the patient does not appear to have an emergency medical condition.  Shealyn Sean, NP 02/06/21  6:04 PM

## 2021-02-06 NOTE — ED Notes (Signed)
Pt incontinent of urine x 1.  Comfort measures given.  Pt resting at present.  Monitoring for safety.

## 2021-02-06 NOTE — ED Notes (Signed)
Agitation meds given without incident.  Pt resting at present.  No distress noted.  Monitoring for safety.

## 2021-02-06 NOTE — ED Notes (Signed)
Patient just arrived on unit 

## 2021-02-06 NOTE — ED Notes (Signed)
Pt increasingly agitated, pt very intrusive with other pt's in the dept.  Difficult to redirect, disturbing the milieu.  Agitation meds ordered.

## 2021-02-06 NOTE — BH Assessment (Signed)
Sarah Wade: ROUTINE Patient is a 20 year old that presents this date with ongoing depression and anxiety. Patient denies any S/I, H/I or AVH. Patient states she resides with her mother and has in the past been diagnosed in 2020 with depression and anxiety. Patient is observed to be anxious and renders a confusing history. Patient states she uses CBD once or twice a month although denies the use of any other substances. Patient denies any S/I, H/I or AVH. Patient reports she was on Zoloft and Seroquel in 2021 although states she has not been on medications for the last two years. Patient is requesting to be evaluated for possible medication interventions to assist with "feeling hopeless and worthless."  A better history can be obtained from note review, see note of 03/04/20

## 2021-02-06 NOTE — BH Assessment (Signed)
Patient is a 20 year old that presents this date with ongoing depression and anxiety. Patient denies any S/I, H/I or AVH. Patient states she resides with her mother and has in the past been diagnosed in 2020 with depression and anxiety. Patient is observed to be anxious and renders a confusing history. Patient states she uses CBD once or twice a month although denies the use of any other substances. Patient denies any S/I, H/I or AVH. Patient reports she was on Zoloft and Seroquel in 2021 although states she has not been on medications for the last two years. Patient is requesting to be evaluated for possible medication interventions to assist with "feeling hopeless and worthless."  A better history can be obtained from note review, see note of 03/04/20

## 2021-02-06 NOTE — BH Assessment (Signed)
Comprehensive Clinical Assessment (CCA) Note  02/06/2021 Sarah Wade [id number]  DISPOSITION: [facility]in NP recommends observation in continuous assessment.  Flowsheet Row ED from 02/06/2021 in [facility]  C-SSRS RISK CATEGORY No Risk       The patient demonstrates the following risk factors for suicide: Chronic risk factors for suicide include: N/A. Acute risk factors for suicide include: N/A. Protective factors for this patient include: coping skills. Considering these factors, the overall suicide risk at this point appears to be low. Patient is not appropriate for outpatient follow up.   Sarah Wade is an 20 female with history of anxiety and depression that presents as a voluntary walk in to [facility]. Patient denies any S/I although reports "she can't go on." Patient renders limited history and is very disorganized and difficult to redirect. When asked in reference to S/I patient is observed to lay her head down and will not respond to this writer's questions. Patient states "no" when asked in reference to H/I or AVH. Patient states "I just need help" although again renders limited history and is unsure of in reference to what services she is interested in this date. As this attempts to further assess patient is observed to respond with vague answers and as this writer attempts to re-frame questions patient stares at this Clinical research associate. Patient states she does "CBD" when asked in reference to SA issues reporting she uses unknown amounts "once or twice a month." Patient cannot recall last use. 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 Patient has a past history of being prescribed Seroquel and Zoloft for symptom management although states this date she "doesn't remember that." Patient per note from 2021 was seeing a therapist Meredith Leeds although when questioned this date patient  states she cannot recall.    [facility]in NP evaluated patient this date and writes: Sarah Wade, 20 y.o., female patient presents to [facility] as a walk in accompanied by her mother with complaints of paranoia, depression, and not sleeping.  Patient seen face to face by this provider, consulted with Dr. Earlene Plater; and chart reviewed on 02/06/21.  On evaluation Sarah Wade reports she came to urgent care because of depression and not sleeping the last couple of days.  Patient denies suicidal ideation but states"I feel like him going to die but I don't want to die and I don't want to kill myself."  Patient also denies homicidal ideation.  Patient reports she does have paranoia stating she feels like everyone can read her mind or hear her thoughts.  Patient states she has not slept in the last couple of days.  Patient denies illicit drug use.  Reported earlier to TTS that she had been doing CBD daily but at this time she states it has been a while since she has done any illicit drugs and it has been a while since she has used CBD.  Patient reports at some point she was diagnosed with schizoaffective disorder and bipolar 1 disorder but is unable to say who diagnosed her or when she was diagnosed.  Patient's mother is at her side and reports that patient has asked her several times if she was able to hear her thoughts.  Reports patient went outside last night in the rain with 7 pair of pants and 7 shirts on stating she was going to [employer].  "I told her she needed to come in  a house and go to sleep and that she can go anywhere until after she is slipped.  She asked me to lay down beside her and rub her back and when I did that I felt how which she was.  I had her go to the bathroom and take all for her wet clothing but she has only slept maybe 10 minutes."  Patient's mother states she is unaware of any prior psychiatric diagnoses.  Patient lives with her mother.  Mother and patient agreed to patient  staying tonight for continuous assessment for safety and stabilization.  Discussed starting medication to help with paranoia and sleep. During evaluation Lincoln Kleiner is alert/oriented x 4; calm/cooperative.  Patient's Wade is anxious and depressed. She does not appear to be responding to internal/external stimuli but there is possible some delusional thinking and paranoia.  Patient feels people can hear her thoughts and read her mind, patient also thinks she may be pregnant.  At this time patient does denies suicidal/self-harm/homicidal ideation and auditory/visual hallucinations.  She does endorse paranoia and possible delusional thinking or thoughts that are not real.  Patient admitted to continuous assessment unit.    Chief Complaint:  Chief Complaint  Patient presents with   Depression   Anxiety   Visit Diagnosis: Unspecified psychosis     CCA Screening, Triage and Referral (STR)  Patient Reported Information How did you hear about [place]? Self  What Is the Reason for Your Visit/Call Today? Patient presents with ongoing anxiety and depression. Patient is observed to be confused and disorganized.  How Long Has This Been Causing You Problems? <Week  What Do You Feel Would Help You the Most Today? Medication(s)   Have You Recently Had Any Thoughts About Hurting Yourself? No  Are You Planning to Commit Suicide/Harm Yourself At This time? No   Have you Recently Had Thoughts About Hurting Someone Karolee Ohs? No  Are You Planning to Harm Someone at This Time? No  Explanation: No data recorded  Have You Used Any Alcohol or Drugs in the Past 24 Hours? No  How Long Ago Did You Use Drugs or Alcohol? No data recorded What Did You Use and How Much? No data recorded  Do You Currently Have a Therapist/Psychiatrist? No  Name of Therapist/Psychiatrist: No data recorded  Have You Been Recently Discharged From Any Office Practice or Programs? No  Explanation of Discharge From  Practice/Program: No data recorded    CCA Screening Triage Referral Assessment Type of Contact: Face-to-Face  Telemedicine Service Delivery:   Is this Initial or Reassessment? No data recorded Date Telepsych consult ordered in [facility]:   (walk in )  Time Telepsych consult ordered in [facility]:  No data recorded Location of Assessment: [address] [facility] Assessment Services  Provider Location: [facility] Assessment Services   Collateral Involvement: None at this time   Does Patient Have a Automotive engineer Guardian? No data recorded Name and Contact of Legal Guardian: -- (no legal guardian )  If Minor and Not Living with Parent(s), Who has Custody? NA  Is CPS involved or ever been involved? Never  Is APS involved or ever been involved? Never   Patient Determined To Be At Risk for Harm To Self or Others Based on Review of Patient Reported Information or Presenting Complaint? No  Method: No data recorded Availability of Means: No data recorded Intent: No data recorded Notification Required: No data recorded Additional Information for Danger to Others Potential: No data recorded Additional Comments for Danger to Others  Potential: No data recorded Are There Guns or Other Weapons in Your Home? No data recorded Types of Guns/Weapons: No data recorded Are These Weapons Safely Secured?                            No data recorded Who Could Verify You Are Able To Have These Secured: No data recorded Do You Have any Outstanding Charges, Pending Court Dates, Parole/Probation? No data recorded Contacted To Inform of Risk of Harm To Self or Others: Other: Comment (NA)    Does Patient Present under Involuntary Commitment? No  IVC Papers Initial File Date: No data recorded  [place] of Residence: [place]   Patient Currently Receiving the Following Services: Not Receiving Services   Determination of Need: Emergent (2 hours)   Options For Referral: Medication Management     CCA  Biopsychosocial Patient Reported Schizophrenia/Schizoaffective Diagnosis in Past: No   Strengths: Pt is willing to participate in treatment   Mental Health Symptoms Depression:   Change in energy/activity; Difficulty Concentrating   Duration of Depressive symptoms:  Duration of Depressive Symptoms: Greater than two weeks   Mania:   None   Anxiety:    Difficulty concentrating; Irritability   Psychosis:   Other negative symptoms   Duration of Psychotic symptoms:  Duration of Psychotic Symptoms: Less than six months   Trauma:   None   Obsessions:   None   Compulsions:   None   Inattention:   None   Hyperactivity/Impulsivity:   None   Oppositional/Defiant Behaviors:   None   Emotional Irregularity:   Chronic feelings of emptiness   Other Wade/Personality Symptoms:   Not assessed    Mental Status Exam Appearance and self-care  Stature:   Average   Weight:   Average weight   Clothing:   Disheveled   Grooming:   Neglected   Cosmetic use:   None   Posture/gait:   Bizarre   Motor activity:   Agitated   Sensorium  Attention:   Confused   Concentration:   Anxiety interferes   Orientation:   X5   Recall/memory:   Normal   Affect and Wade  Affect:   Anxious   Wade:   Anxious   Relating  Eye contact:   Fleeting   Facial expression:   Anxious; Depressed; Fearful   Attitude toward examiner:   Uninterested; Guarded   Thought and Language  Speech flow:  Articulation error   Thought content:   Suspicious   Preoccupation:   None   Hallucinations:   None   Organization:  No data recorded  [employer] of Knowledge:   Fair   Intelligence:   Average   Abstraction:   Abstract   Judgement:   Fair   Dance movement psychotherapist:   Realistic   Insight:   Fair   Decision Making:   Confused   Social Functioning  Social Maturity:   Responsible   Social Judgement:   Normal   Stress  Stressors:   --  Industrial/product designer)   Coping Ability:   Exhausted   Skill Deficits:   Activities of daily living   Supports:   Usual     Religion: Religion/Spirituality Are You A Religious Person?: No  Leisure/Recreation: Leisure / Recreation Do You Have Hobbies?: No  Exercise/Diet: Exercise/Diet Do You Exercise?: No Have You Gained or Lost A Significant Amount of Weight in the Past Six Months?: No Do You Follow a  Special Diet?: No Do You Have Any Trouble Sleeping?: No   CCA Employment/Education Employment/Work Situation: Employment / Work Situation Employment Situation: Unemployed  Education: Education Did You Have An Individualized Education Program (IIEP): No Did You Have Any Difficulty At [employer]?: No Patient's Education Has Been Impacted by Current Illness: No   CCA Family/Childhood History Family and Relationship History:    Childhood History:     Child/Adolescent Assessment:     CCA Substance Use Alcohol/Drug Use: Alcohol / Drug Use Pain Medications: SEE MAR Prescriptions: SEE MAR Over the Counter: SEE MAR History of alcohol / drug use?: Yes Substance #1 Name of Substance 1: CBD/THC 1 - Age of First Use: UTA 1 - Amount (size/oz): Varies 1 - Frequency: Varies 1 - Duration: Ongoing 1 - Last Use / Amount: UTA                       ASAM's:  Six Dimensions of Multidimensional Assessment  Dimension 1:  Acute Intoxication and/or Withdrawal Potential:   Dimension 1:  Description of individual's past and current experiences of substance use and withdrawal: 1  Dimension 2:  Biomedical Conditions and Complications:   Dimension 2:  Description of patient's biomedical conditions and  complications: 1  Dimension 3:  Emotional, Behavioral, or Cognitive Conditions and Complications:  Dimension 3:  Description of emotional, behavioral, or cognitive conditions and complications: 1  Dimension 4:  Readiness to Change:  Dimension 4:  Description of Readiness to Change  criteria: 2  Dimension 5:  Relapse, Continued use, or Continued Problem Potential:  Dimension 5:  Relapse, continued use, or continued problem potential critiera description: 2  Dimension 6:  Recovery/Living Environment:  Dimension 6:  Recovery/Iiving environment criteria description: 1  ASAM Severity Score: ASAM's Severity Rating Score: 8  ASAM Recommended Level of Treatment:     Substance use Disorder (SUD)    Recommendations for Services/Supports/Treatments:    Discharge Disposition:    DSM5 Diagnoses: Patient Active Problem List   Diagnosis Date Noted   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

## 2021-02-06 NOTE — Progress Notes (Signed)
Received Shareta this PM in the assessment room in her her mom's arms.. She urinated on the floor prior to giving a urine specimen. Mom nor the patient was able to give a rationale for the urine accident. She was unable to give a urine specimen. She was cooperative with the admission process. She is disorganized and mute. The skin assessment was done with mom present. She was given dry clothes after her arrival to the unit and had difficulty following directions. She was trying the doors, trying to open cabinet doors and moving the chair bed and hanging her head over the bed. She was redirected several times.

## 2021-02-07 MED ORDER — OLANZAPINE 5 MG PO TABS
5.0000 mg | ORAL_TABLET | Freq: Once | ORAL | Status: AC
  Administered 2021-02-07: 5 mg via ORAL
  Filled 2021-02-07: qty 1

## 2021-02-07 NOTE — ED Notes (Signed)
Pt sleeping@this time. Breathing even and unlabored. Will continue to monitor for safety 

## 2021-02-07 NOTE — ED Notes (Signed)
No distress noted - will continue to monitor for safety

## 2021-02-07 NOTE — ED Notes (Signed)
No pain or discomfort noted/ reported. Pt alert, oriented, and ambulatory.  Spoke with intake person at [facility] per social work.  Pt is currently being reviewed for a potential inpatient bed.  Breathing is even and  unlabored.  Will continue to monitor for safety.

## 2021-02-07 NOTE — ED Notes (Signed)
No pain or discomfort noted/ reported. Pt alert, oriented, and ambulatory.  Breathing is even and  unlabored.  Will continue to monitor for safety. 

## 2021-02-07 NOTE — ED Notes (Signed)
Spoke with provider re: patient wanting to leave - will assess when he can - patient updated and agreeable to stay for "awhile"

## 2021-02-07 NOTE — Progress Notes (Signed)
Pt was accept to [facility] tomorrow 02/08/21 after 10:00am. Pt accepted to the [facility].  Pt meets inpatient criteria per Assunta Found, NP  Attending Physician will be Dr. Dorris Carnes  Report can be called to: [phone]  Pt can arrive after 10:00am  Pt accepted to [facility] tomorrow 02/08/21 after 10:00am. Accepting physician: Dr. Dorris Carnes. Pt will admit to [facility]. Phone number for report to be called to [facility] [phone]. CSW spoke with [place] who provided accepting information.  Care team notified via secure chat: Theda Belfast, RN, Donalee Citrin LPN,  Assunta Found, NP, Theone Murdoch, RN, and Earlene Plater, MD.  Kelton Pillar, LCSWA 02/07/2021 @ 11:10 PM

## 2021-02-07 NOTE — ED Notes (Signed)
Pt came to this nurse asking when she would be d/c stating she is ready to go home. This nurse advised patient I would have to speak with the provider.

## 2021-02-07 NOTE — ED Notes (Signed)
Provider notified of pacing and increasing acttivity/ agitation

## 2021-02-07 NOTE — ED Provider Notes (Signed)
    Sarah Wade, 20 yr., female patient presents to [facility] as a walk in accompanied by her mother with complaints of paranoia, depression, and not sleeping.    Patient continues to present with paranoia, and delusional thinking.  Patient states she slept some last night but is feeling no better.  States she did not eat breakfast because did not have an appetite at the time.  Patient denies suicidal/self-harm/homicidal ideation.  States she continues to feel paranoia.  Tolerating medication with no adverse effect.    No changes at this time  Will continue to recommend inpatient psychiatric treatment

## 2021-02-07 NOTE — Progress Notes (Signed)
Patient has been denied by [facility] due to no appropriate beds being available. Patient meets criteria for [facility] inpatient per the recommendation of Dr. Bronwen Betters. Patient has been faxed out to the following facilities:   [facility]  [address] [place] [address] [phone]  [facility]  [address] [place] [address] [phone]  [facility]  [address], [place] [address] [phone]  [facility]  [address] [place] [address] [phone]  [facility]  [address], [place] [address] [phone]  [facility]  [address] [place], [place] [address] [phone]  [facility]  [address] [place], [place] [address] [phone]  [facility]  [phone] [place] [address] [phone]  [facility]  [address] [place] [address] [phone]  [facility]  [address], [place] [address] [phone]  [facility]  [address] [place] [address] [phone]  [facility]  [address] [place] [address] [phone]   Damita Dunnings, MSW, LCSW-A  10:15 AM 02/07/2021

## 2021-02-07 NOTE — ED Notes (Signed)
Patient resting with no sxs of distress - will monitor for safety

## 2021-02-08 ENCOUNTER — Telehealth (HOSPITAL_COMMUNITY): Payer: Self-pay | Admitting: Internal Medicine

## 2021-02-08 DIAGNOSIS — R45851 Suicidal ideations: Secondary | ICD-10-CM | POA: Diagnosis not present

## 2021-02-08 DIAGNOSIS — F332 Major depressive disorder, recurrent severe without psychotic features: Secondary | ICD-10-CM | POA: Diagnosis not present

## 2021-02-08 DIAGNOSIS — F339 Major depressive disorder, recurrent, unspecified: Secondary | ICD-10-CM | POA: Diagnosis not present

## 2021-02-08 DIAGNOSIS — R69 Illness, unspecified: Secondary | ICD-10-CM | POA: Diagnosis not present

## 2021-02-08 DIAGNOSIS — F419 Anxiety disorder, unspecified: Secondary | ICD-10-CM | POA: Diagnosis not present

## 2021-02-08 DIAGNOSIS — F411 Generalized anxiety disorder: Secondary | ICD-10-CM | POA: Diagnosis not present

## 2021-02-08 DIAGNOSIS — R451 Restlessness and agitation: Secondary | ICD-10-CM | POA: Diagnosis not present

## 2021-02-08 DIAGNOSIS — G4700 Insomnia, unspecified: Secondary | ICD-10-CM | POA: Diagnosis not present

## 2021-02-08 DIAGNOSIS — F909 Attention-deficit hyperactivity disorder, unspecified type: Secondary | ICD-10-CM | POA: Diagnosis not present

## 2021-02-08 NOTE — ED Notes (Signed)
Pt refused EKG after several attempts at getting pt to comply. Safety maintained. NP notified.

## 2021-02-08 NOTE — ED Notes (Signed)
Breakfast given.  

## 2021-02-08 NOTE — ED Provider Notes (Signed)
FBC/OBS ASAP Discharge Summary  Date and Time: 02/08/2021 11:37 AM  Name: Sarah Wade  MRN:  [id number]   Patient accepted to [facility] for inpatient psychiatric treatment.    Discharge Diagnoses:  Final diagnoses:  Adjustment disorder with mixed anxiety and depressed mood  Brief psychotic disorder (HCC)   Social History:  Social History   Substance and Sexual Activity  Alcohol Use No     Social History   Substance and Sexual Activity  Drug Use No    Social History   Socioeconomic History   Marital status: Single    Spouse name: Not on file   Number of children: Not on file   Years of education: Not on file   Highest education level: Not on file  Occupational History   Not on file  Tobacco Use   Smoking status: Never   Smokeless tobacco: Never  Substance and Sexual Activity   Alcohol use: No   Drug use: No   Sexual activity: Not on file  Other Topics Concern   Not on file  Social History Narrative   Not on file   Social Determinants of Health   Financial Resource Strain: Not on file  Food Insecurity: Not on file  Transportation Needs: Not on file  Physical Activity: Not on file  Stress: Not on file  Social Connections: Not on file   SDOH:  SDOH Screenings   Alcohol Screen: Not on file  Depression (PHQ2-9): Not on file  Financial Resource Strain: Not on file  Food Insecurity: Not on file  Housing: Not on file  Physical Activity: Not on file  Social Connections: Not on file  Stress: Not on file  Tobacco Use: Low Risk    Smoking Tobacco Use: Never   Smokeless Tobacco Use: Never  Transportation Needs: Not on file  Current Medications:  Current Facility-Administered Medications  Medication Dose Route Frequency Provider Last Rate Last Admin   alum & mag hydroxide-simeth (MAALOX/MYLANTA) 200-200-20 MG/5ML suspension 30 mL  30 mL Oral Q4H PRN Nakaiya Beddow B, NP       hydrOXYzine (ATARAX/VISTARIL) tablet 25 mg  25 mg Oral TID PRN Everton Bertha,  Chriselda Leppert B, NP   25 mg at 02/07/21 2217   magnesium hydroxide (MILK OF MAGNESIA) suspension 30 mL  30 mL Oral Daily PRN Rilyn Scroggs B, NP       OLANZapine zydis (ZYPREXA) disintegrating tablet 5 mg  5 mg Oral QHS Canaan Holzer B, NP   5 mg at 02/07/21 2128   traZODone (DESYREL) tablet 50 mg  50 mg Oral QHS PRN Granite Godman B, NP   50 mg at 02/07/21 2217   No current outpatient medications on file.    Disposition: Patient to be transferred to [facility] for inpatient psychiatric treatment.    Criag Wicklund, NP 02/08/2021, 11:37 AM

## 2021-02-08 NOTE — ED Notes (Signed)
Pt discharged with [facility] to be transferred to [facility]. IVC status. Safety maintained.

## 2021-02-08 NOTE — ED Notes (Signed)
Pt sleeping in no acute distress. RR even and unlabored. Safety maintained. 

## 2021-02-08 NOTE — ED Notes (Addendum)
Pt was ordered EKG's at 6pm when Kody Vigil and Amy came on for shift change she was getting shots and went to sleep and refused.

## 2021-02-08 NOTE — ED Notes (Signed)
Received call from [facility] stating ETA 15 min

## 2021-02-08 NOTE — ED Notes (Addendum)
Pt laying down resting in no acute distress. IVC paperwork served. Pt made aware of transport. Sheriff called for pickup.

## 2021-02-08 NOTE — ED Notes (Signed)
Lunch given.

## 2021-02-08 NOTE — BH Assessment (Signed)
Care Management - Follow Up [facility] Discharges   Patient has been placed in an inpatient psychiatric hospital [facility]).

## 2021-03-24 DIAGNOSIS — F428 Other obsessive-compulsive disorder: Secondary | ICD-10-CM | POA: Diagnosis not present

## 2021-03-24 DIAGNOSIS — R69 Illness, unspecified: Secondary | ICD-10-CM | POA: Diagnosis not present

## 2021-09-08 DIAGNOSIS — Z1159 Encounter for screening for other viral diseases: Secondary | ICD-10-CM | POA: Diagnosis not present

## 2021-10-26 DIAGNOSIS — Z124 Encounter for screening for malignant neoplasm of cervix: Secondary | ICD-10-CM | POA: Diagnosis not present

## 2021-10-26 DIAGNOSIS — Z01419 Encounter for gynecological examination (general) (routine) without abnormal findings: Secondary | ICD-10-CM | POA: Diagnosis not present

## 2021-12-07 ENCOUNTER — Emergency Department (HOSPITAL_BASED_OUTPATIENT_CLINIC_OR_DEPARTMENT_OTHER): Payer: 59

## 2021-12-07 ENCOUNTER — Ambulatory Visit
Admission: EM | Admit: 2021-12-07 | Discharge: 2021-12-07 | Disposition: A | Discharge status: Home / Self Care | Payer: No Typology Code available for payment source

## 2021-12-07 ENCOUNTER — Emergency Department (HOSPITAL_BASED_OUTPATIENT_CLINIC_OR_DEPARTMENT_OTHER)
Admission: EM | Admit: 2021-12-07 | Discharge: 2021-12-07 | Disposition: A | Discharge status: Home / Self Care | Payer: 59 | Attending: Emergency Medicine | Admitting: Emergency Medicine

## 2021-12-07 ENCOUNTER — Encounter (HOSPITAL_BASED_OUTPATIENT_CLINIC_OR_DEPARTMENT_OTHER): Payer: Self-pay | Admitting: Emergency Medicine

## 2021-12-07 ENCOUNTER — Other Ambulatory Visit: Payer: Self-pay

## 2021-12-07 DIAGNOSIS — K5900 Constipation, unspecified: Secondary | ICD-10-CM | POA: Diagnosis not present

## 2021-12-07 DIAGNOSIS — N83291 Other ovarian cyst, right side: Secondary | ICD-10-CM | POA: Insufficient documentation

## 2021-12-07 DIAGNOSIS — R109 Unspecified abdominal pain: Secondary | ICD-10-CM

## 2021-12-07 DIAGNOSIS — N83201 Unspecified ovarian cyst, right side: Secondary | ICD-10-CM

## 2021-12-07 DIAGNOSIS — R102 Pelvic and perineal pain: Secondary | ICD-10-CM | POA: Diagnosis not present

## 2021-12-07 DIAGNOSIS — M545 Low back pain, unspecified: Secondary | ICD-10-CM

## 2021-12-07 DIAGNOSIS — R1031 Right lower quadrant pain: Secondary | ICD-10-CM | POA: Diagnosis present

## 2021-12-07 DIAGNOSIS — N3289 Other specified disorders of bladder: Secondary | ICD-10-CM | POA: Diagnosis not present

## 2021-12-07 DIAGNOSIS — K6289 Other specified diseases of anus and rectum: Secondary | ICD-10-CM

## 2021-12-07 LAB — COMPREHENSIVE METABOLIC PANEL
ALT: 8 U/L (ref 0–44)
AST: 15 U/L (ref 15–41)
Albumin: 4.5 g/dL (ref 3.5–5.0)
Alkaline Phosphatase: 66 U/L (ref 38–126)
Anion gap: 11 (ref 5–15)
BUN: 8 mg/dL (ref 6–20)
CO2: 23 mmol/L (ref 22–32)
Calcium: 9.3 mg/dL (ref 8.9–10.3)
Chloride: 105 mmol/L (ref 98–111)
Creatinine, Ser: 0.81 mg/dL (ref 0.44–1.00)
GFR, Estimated: >60 mL/min (ref >60)
Glucose, Bld: 83 mg/dL (ref 70–99)
Potassium: 3.6 mmol/L (ref 3.5–5.1)
Sodium: 139 mmol/L (ref 135–145)
Total Bilirubin: 1.6 mg/dL — ABNORMAL HIGH (ref 0.3–1.2)
Total Protein: 7.1 g/dL (ref 6.5–8.1)

## 2021-12-07 LAB — CBC
HCT: 40.6 % (ref 36.0–46.0)
Hemoglobin: 14 g/dL (ref 12.0–15.0)
MCH: 30 pg (ref 26.0–34.0)
MCHC: 34.5 g/dL (ref 30.0–36.0)
MCV: 86.9 fL (ref 80.0–100.0)
Platelets: 189 10*3/uL (ref 150–400)
RBC: 4.67 MIL/uL (ref 3.87–5.11)
RDW: 13.4 % (ref 11.5–15.5)
WBC: 7.4 10*3/uL (ref 4.0–10.5)
nRBC: 0 % (ref 0.0–0.2)

## 2021-12-07 LAB — URINALYSIS, ROUTINE W REFLEX MICROSCOPIC
Bilirubin Urine: NEGATIVE
Glucose, UA: NEGATIVE mg/dL
Hgb urine dipstick: NEGATIVE
Ketones, ur: NEGATIVE mg/dL
Leukocytes,Ua: NEGATIVE
Nitrite: NEGATIVE
Protein, ur: NEGATIVE mg/dL
Specific Gravity, Urine: <1.005 — ABNORMAL LOW (ref 1.005–1.030)
pH: 7.5 (ref 5.0–8.0)

## 2021-12-07 LAB — HCG, SERUM, QUALITATIVE: Preg, Serum: NEGATIVE

## 2021-12-07 LAB — LIPASE, BLOOD: Lipase: 22 U/L (ref 11–51)

## 2021-12-07 MED ORDER — IOHEXOL 300 MG/ML  SOLN
100.0000 mL | Freq: Once | INTRAMUSCULAR | Status: AC | PRN
  Administered 2021-12-07: 75 mL via INTRAVENOUS

## 2021-12-07 MED ORDER — KETOROLAC TROMETHAMINE 30 MG/ML IJ SOLN
30.0000 mg | Freq: Once | INTRAMUSCULAR | Status: AC
  Administered 2021-12-07: 30 mg via INTRAVENOUS

## 2021-12-07 MED ORDER — IOHEXOL 9 MG/ML PO SOLN
500.0000 mL | ORAL | Status: AC
  Administered 2021-12-07: 500 mL via ORAL

## 2021-12-07 NOTE — ED Triage Notes (Signed)
Pt arrives to ED with c/o lower abdominal pain  and rectal pain that started at 1130am this morning. Associated symptoms nausea, dysuria.

## 2021-12-07 NOTE — Discharge Instructions (Signed)
Go to the emergency department as soon as you leave urgent care for further evaluation and management. 

## 2021-12-07 NOTE — ED Provider Notes (Signed)
[facility]    CSN: [id number] Arrival date & time: 12/07/21  1330      History   Chief Complaint Chief Complaint  Patient presents with   Abdominal Pain   Nausea    HPI Sarah Wade is a 21 y.o. female.   Patient presents with sudden onset of severe abdominal pain, lower back pain, rectal pain that started today around 11 AM.  Patient reports abdominal pain is located in the lower abdomen and is rated 9/10 on pain scale.  Patient states that she was not able to walk very well when pain first started today.  She characterizes pain as cramping and burning pain.  Pain radiates around to her back and down to her rectum as well.  Patient reports last bowel movement was this morning.  Denies any associated nausea or vomiting.   Denies urinary burning, urinary frequency, abnormal vaginal discharge, abnormal vaginal bleeding, hematuria, fever.  Last menstrual cycle was at the end of July but patient not sure of exact date.  Patient denies any chance of pregnancy as she has not been sexually active since November 2022. Denies blood in stool.    Abdominal Pain   No past medical history on file.  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  OB History   No obstetric history on file.      Home Medications    Prior to Admission medications   Not on File    Family History No family history on file.  Social History Social History   Tobacco Use   Smoking status: Never   Smokeless tobacco: Never  Substance Use Topics   Alcohol use: No   Drug use: No     Allergies   Tylenol [acetaminophen] and Nickel   Review of Systems Review of Systems Per HPI  Physical Exam Triage Vital Signs ED Triage Vitals  Enc Vitals  Group     BP 12/07/21 1347 110/74     Pulse Rate 12/07/21 1347 98     Resp 12/07/21 1347 15     Temp 12/07/21 1347 (!) 97.4 F (36.3 C)     Temp src --      SpO2 12/07/21 1347 98 %     Weight --      Height --      Head Circumference --      Peak Flow --      Pain Score 12/07/21 1346 8     Pain Loc --      Pain Edu? --      Excl. in GC? --    No data found.  Updated Vital Signs BP 110/74   Pulse 98   Temp (!) 97.4 F (36.3 C)   Resp 15   SpO2 98%   Visual Acuity Right Eye Distance:   Left Eye Distance:   Bilateral Distance:    Right Eye Near:   Left Eye Near:    Bilateral Near:     Physical Exam Constitutional:      General: She is not in acute distress.    Appearance: Normal appearance. She is not toxic-appearing or diaphoretic.  HENT:     Head: Normocephalic and atraumatic.  Eyes:     Extraocular Movements: Extraocular movements intact.  Conjunctiva/sclera: Conjunctivae normal.  Cardiovascular:     Rate and Rhythm: Normal rate and regular rhythm.     Pulses: Normal pulses.     Heart sounds: Normal heart sounds.  Pulmonary:     Effort: Pulmonary effort is normal. No respiratory distress.     Breath sounds: Normal breath sounds.  Abdominal:     General: Bowel sounds are normal. There is no distension.     Palpations: Abdomen is soft.     Tenderness: There is abdominal tenderness in the right lower quadrant, suprapubic area and left lower quadrant.  Neurological:     General: No focal deficit present.     Mental Status: She is alert and oriented to person, place, and time. Mental status is at baseline.  Psychiatric:        Mood and Affect: Mood normal.        Behavior: Behavior normal.        Thought Content: Thought content normal.        Judgment: Judgment normal.      UC Treatments / Results  Labs (all labs ordered are listed, but only abnormal results are displayed) Labs Reviewed - No data to display  EKG   Radiology No results  found.  Procedures Procedures (including critical care time)  Medications Ordered in UC Medications - No data to display  Initial Impression / Assessment and Plan / UC Course  I have reviewed the triage vital signs and the nursing notes.  Pertinent labs & imaging results that were available during my care of the patient were reviewed by me and considered in my medical decision making (see chart for details).     Sudden onset of severe abdominal pain, I do think imaging of the abdomen is warranted.  Patient advised to go to the ER for further evaluation and management given that CT imaging is not present here in urgent care.  Patient was agreeable with plan.  Vital signs stable at discharge.  Agree with patient self transport to the hospital. Final Clinical Impressions(s) / UC Diagnoses   Final diagnoses:  Sudden onset of severe abdominal pain  Acute bilateral low back pain without sciatica  Rectal pain     Discharge Instructions      Go to the emergency department as soon as you leave urgent care for further evaluation and management.    ED Prescriptions   None    PDMP not reviewed this encounter.   Gustavus Bryant, [place] 12/07/21 1413

## 2021-12-07 NOTE — ED Provider Notes (Signed)
[facility] EMERGENCY DEPT Provider Note   CSN: [id number] Arrival date & time: 12/07/21  1433     History  Chief Complaint  Patient presents with   Abdominal Pain   Rectal Pain    Onia Shiflett is a 21 y.o. female.  Has no significant past medical history.  She said awoke this morning with some nausea.  Moved her bowels around 11 AM and started with lower abdominal pain and rectal pain.  She did say she had a hard bowel movement but no blood.  Since then the pain has been present and severe sometimes radiates into her thighs.  Worse with bending and ambulating.  No fevers or chills nausea no vomiting.  Did have some discomfort with urination.  She said she sometimes gets some low abdominal pain with her menses but is not due for another week.  Has not been sexually active for almost a year and no vaginal discharge or bleeding.  No rectal penetration.  The history is provided by the patient.  Abdominal Pain Pain location:  RLQ, LLQ and suprapubic Pain quality: aching and cramping   Pain radiates to:  L leg and R leg Pain severity:  Severe Onset quality:  Sudden Duration:  4 hours Timing:  Constant Progression:  Waxing and waning Chronicity:  New Context: not recent sexual activity and not trauma   Relieved by:  Nothing Worsened by:  Movement Ineffective treatments:  None tried Associated symptoms: constipation, dysuria and nausea   Associated symptoms: no chest pain, no cough, no diarrhea, no fever, no hematemesis, no hematochezia, no hematuria, no vaginal bleeding, no vaginal discharge and no vomiting   Risk factors: has not had multiple surgeries        Home Medications Prior to Admission medications   Not on File      Allergies    Tylenol [acetaminophen] and Nickel    Review of Systems   Review of Systems  Constitutional:  Negative for fever.  Eyes:  Negative for visual disturbance.  Respiratory:  Negative for cough.   Cardiovascular:  Negative  for chest pain.  Gastrointestinal:  Positive for abdominal pain, constipation and nausea. Negative for diarrhea, hematemesis, hematochezia and vomiting.  Genitourinary:  Positive for dysuria. Negative for hematuria, vaginal bleeding and vaginal discharge.    Physical Exam Updated Vital Signs BP 128/87 (BP Location: Right Arm)   Pulse (!) 104   Temp 98.8 F (37.1 C)   Resp 16   Ht 5\' 4"  (1.626 m)   Wt 51.7 kg   SpO2 100%   BMI 19.57 kg/m  Physical Exam Vitals and nursing note reviewed.  Constitutional:      General: She is not in acute distress.    Appearance: Normal appearance. She is well-developed.  HENT:     Head: Normocephalic and atraumatic.  Eyes:     Conjunctiva/sclera: Conjunctivae normal.  Cardiovascular:     Rate and Rhythm: Normal rate and regular rhythm.     Heart sounds: No murmur heard. Pulmonary:     Effort: Pulmonary effort is normal. No respiratory distress.     Breath sounds: Normal breath sounds.  Abdominal:     Palpations: Abdomen is soft.     Tenderness: There is no abdominal tenderness. There is no guarding or rebound.  Musculoskeletal:        General: Normal range of motion.     Cervical back: Neck supple.     Right lower leg: No edema.     Left  lower leg: No edema.  Skin:    General: Skin is warm and dry.     Capillary Refill: Capillary refill takes less than 2 seconds.  Neurological:     General: No focal deficit present.     Mental Status: She is alert.     Gait: Gait normal.     ED Results / Procedures / Treatments   Labs (all labs ordered are listed, but only abnormal results are displayed) Labs Reviewed  COMPREHENSIVE METABOLIC PANEL - Abnormal; Notable for the following components:      Result Value   Total Bilirubin 1.6 (*)    All other components within normal limits  URINALYSIS, ROUTINE W REFLEX MICROSCOPIC - Abnormal; Notable for the following components:   Specific Gravity, Urine <1.005 (*)    All other components within  normal limits  LIPASE, BLOOD  CBC  HCG, SERUM, QUALITATIVE    EKG None  Radiology US Pelvis Complete  Result Date: 12/07/2021 CLINICAL DATA:  Right adnexal lesion on recent CT EXAM: TRANSABDOMINAL ULTRASOUND OF PELVIS DOPPLER ULTRASOUND OF OVARIES TECHNIQUE: Transabdominal ultrasound examination of the pelvis was performed including evaluation of the uterus, ovaries, adnexal regions, and pelvic cul-de-sac. Color and duplex Doppler ultrasound was utilized to evaluate blood flow to the ovaries. COMPARISON:  CT from earlier in the same day. FINDINGS: Uterus Measurements: 6.6 x 3.3 x 3.8 cm. = volume: 43 mL. No fibroids or other mass visualized. Endometrium Thickness: 8 mm.  No focal abnormality visualized. Right ovary Measurements: 4.6 x 3.6 x 3.8 cm. = volume: 33 mL. Cystic lesion is noted with increased echogenicity centrally measuring up to 3.8 cm. This corresponds to that seen on recent CT and is again consistent with a hemorrhagic cyst. Left ovary Measurements: 3.3 x 2.1 x 2.6 cm. = volume: 9.5 mL. Normal appearance/no adnexal mass. Pulsed Doppler evaluation demonstrates normal low-resistance arterial and venous waveforms in both ovaries. Other: None IMPRESSION: 3.8 cm cystic lesion in the right adnexa consistent with hemorrhagic cyst. No further follow-up is recommended. No other focal abnormality is noted. Electronically Signed   By: Alcide Clever M.D.   On: 12/07/2021 19:55   [place] Art/Ven Flow Abd Pelv Doppler  Result Date: 12/07/2021 CLINICAL DATA:  Right adnexal lesion on recent CT EXAM: TRANSABDOMINAL ULTRASOUND OF PELVIS DOPPLER ULTRASOUND OF OVARIES TECHNIQUE: Transabdominal ultrasound examination of the pelvis was performed including evaluation of the uterus, ovaries, adnexal regions, and pelvic cul-de-sac. Color and duplex Doppler ultrasound was utilized to evaluate blood flow to the ovaries. COMPARISON:  CT from earlier in the same day. FINDINGS: Uterus Measurements: 6.6 x 3.3 x 3.8 cm. =  volume: 43 mL. No fibroids or other mass visualized. Endometrium Thickness: 8 mm.  No focal abnormality visualized. Right ovary Measurements: 4.6 x 3.6 x 3.8 cm. = volume: 33 mL. Cystic lesion is noted with increased echogenicity centrally measuring up to 3.8 cm. This corresponds to that seen on recent CT and is again consistent with a hemorrhagic cyst. Left ovary Measurements: 3.3 x 2.1 x 2.6 cm. = volume: 9.5 mL. Normal appearance/no adnexal mass. Pulsed Doppler evaluation demonstrates normal low-resistance arterial and venous waveforms in both ovaries. Other: None IMPRESSION: 3.8 cm cystic lesion in the right adnexa consistent with hemorrhagic cyst. No further follow-up is recommended. No other focal abnormality is noted. Electronically Signed   By: Alcide Clever M.D.   On: 12/07/2021 19:55   CT Abdomen Pelvis W Contrast  Result Date: 12/07/2021 CLINICAL DATA:  A 21 year old female presents  with abdominal pain. EXAM: CT ABDOMEN AND PELVIS WITH CONTRAST TECHNIQUE: Multidetector CT imaging of the abdomen and pelvis was performed using the standard protocol following bolus administration of intravenous contrast. RADIATION DOSE REDUCTION: This exam was performed according to the departmental dose-optimization program which includes automated exposure control, adjustment of the mA and/or kV according to patient size and/or use of iterative reconstruction technique. CONTRAST:  64mL OMNIPAQUE IOHEXOL 300 MG/ML  SOLN COMPARISON:  No recent comparison imaging is available. FINDINGS: Lower chest: Lung bases are clear. No effusion. No consolidative changes. Hepatobiliary: Protrusion of the liver in the subxiphoid region without hernia, likely associated to laxity and mild pectus excavatum. Smooth contours of the liver otherwise without focal suspicious abnormality or pericholecystic stranding. No biliary duct dilation. Portal vein is patent. Pancreas: Normal, without mass, inflammation or ductal dilatation. Spleen:  Normal. Adrenals/Urinary Tract: Adrenal glands are unremarkable. Symmetric renal enhancement without hydronephrosis. Moderate distension of the urinary bladder 10 x 7 x 10 (volume = 400 cc) cm Stomach/Bowel: Normal appendix. Signs of constipation with abundant stool throughout the colon. No inflammation adjacent to the colon as indicated by stranding. Stomach under distended without adjacent stranding. No sign of small bowel obstruction. Vascular/Lymphatic: Aorta with smooth contours. IVC with smooth contours. No aneurysmal dilation of the abdominal aorta. There is no gastrohepatic or hepatoduodenal ligament lymphadenopathy. No retroperitoneal or mesenteric lymphadenopathy. No pelvic sidewall lymphadenopathy. Reproductive: Cystic RIGHT adnexal lesion measures 4.0 x 3.1 cm and with density values of 46 Hounsfield units, likely hemorrhagic RIGHT adnexal cyst. Reproductive structures otherwise unremarkable. Other: No free air. Small volume intermediate density fluid along the RIGHT flank along the inferior margin of the RIGHT hemiliver (image 48/5) this measures 33 Hounsfield unit density and shows thickness of 11 mm tracking along the inferior margin in a lenticular fashion of the RIGHT hemiliver. Likely small volume hemoperitoneum. Musculoskeletal: Abdominal wall laxity beneath the xiphoid, may be due in part to mild pectus deformity. No acute musculoskeletal findings. IMPRESSION: 1. Hemorrhagic RIGHT ovarian cyst and small volume hemoperitoneum may reflect partial rupture of a hemorrhagic cyst in this young patient. Correlate with clinical symptoms. Would also correlate with any signs for of or risk factors for infection/PID. 2. Constipation. 3. Normal appendix. 4. Individual bowel loops are difficult to assess but there is no sign of obstruction. Lack of intra-abdominal and retroperitoneal fat does limit assessment on the current exam. 5. Moderate distension of the urinary bladder without adjacent stranding is of  uncertain significance in may be physiologic. Correlate with signs of urinary retention or symptoms suggesting bladder outlet obstruction Electronically Signed   By: Donzetta Kohut M.D.   On: 12/07/2021 16:56    Procedures Procedures    Medications Ordered in ED Medications  iohexol (OMNIPAQUE) 9 MG/ML oral solution 500 mL (500 mLs Oral Contrast Given 12/07/21 1548)  iohexol (OMNIPAQUE) 300 MG/ML solution 100 mL (75 mLs Intravenous Contrast Given 12/07/21 1626)  ketorolac (TORADOL) 30 MG/ML injection 30 mg (30 mg Intravenous Given 12/07/21 1712)    ED Course/ Medical Decision Making/ A&P Clinical Course as of 12/08/21 1002  Thu Dec 07, 2021  1701 CT showing possible ruptured right ovarian cyst with some signs of some free fluid.  No appendicitis.  Reviewed with patient and will put in for pelvic ultrasound. [MB]    Clinical Course User Index [MB] Terrilee Files, MD  Medical Decision Making Amount and/or Complexity of Data Reviewed Labs: ordered. Radiology: ordered.  Risk Prescription drug management.   This patient complains of pelvic pain rectal pain; this involves an extensive number of treatment Options and is a complaint that carries with it a high risk of complications and morbidity. The differential includes appendicitis, diverticulitis, torsion, ovarian cyst, PID, abscess  I ordered, reviewed and interpreted labs, which included CBC normal, LFTs and chemistries normal other than mildly elevated bili, urinalysis without signs of infection, pregnancy test negative I ordered medication IV Toradol with improvement in her pain and reviewed PMP when indicated. I ordered imaging studies which included CT abdomen and pelvis and pelvic ultrasound and I independently    visualized and interpreted imaging which showed likely ruptured hemorrhagic cyst Additional history obtained from patient's family members Previous records obtained and reviewed in epic no  recent admissions  Cardiac monitoring reviewed, normal sinus rhythm Social determinants considered, no significant barriers Critical Interventions: None  After the interventions stated above, I reevaluated the patient and found patient to be well-appearing with improving abdominal exam and symptoms Admission and further testing considered, no indications for admission or further work-up at this time.  Her pain is manageable and she does not require any further pain medications.  She has a gynecologist to follow-up with.  Return instructions discussed.         Final Clinical Impression(s) / ED Diagnoses Final diagnoses:  Hemorrhagic cyst of right ovary    Rx / DC Orders ED Discharge Orders     None         Terrilee Files, MD 12/08/21 1004

## 2021-12-07 NOTE — ED Triage Notes (Addendum)
Pt presents to uc with co of abd pain, nausea, decreased appetite. Pt st abd pain came on suddenly this morning and she thought it may be period cramps at first as it radiated to her back and rectum pt st she checked her cal and she is not due to start menstruation, pt reports pain is worse when pressed on and is constant 8/10 in generalized lower abd.

## 2021-12-07 NOTE — ED Notes (Signed)
Patient transported to [facility] 

## 2021-12-07 NOTE — Discharge Instructions (Signed)
You are seen in the emergency department for right lower abdominal pain.  You had a CAT scan and ultrasound that showed a hemorrhagic cyst on the right.  These will usually improve on their own.  You can use ibuprofen 3 times a day with food on your stomach.  Warm compress.  Follow-up with your primary care doctor or gynecology.  Return to the emergency department if any worsening or concerning symptoms

## 2021-12-07 NOTE — ED Notes (Signed)
Patient is being discharged from the Urgent Care and sent to the Emergency Department via pov . Per mound np, patient is in need of higher level of care due to abd pain. Patient is aware and verbalizes understanding of plan of care.  Vitals:   12/07/21 1347  BP: 110/74  Pulse: 98  Resp: 15  Temp: (!) 97.4 F (36.3 C)  SpO2: 98%

## 2021-12-11 DIAGNOSIS — N83209 Unspecified ovarian cyst, unspecified side: Secondary | ICD-10-CM | POA: Diagnosis not present

## 2021-12-21 DIAGNOSIS — R69 Illness, unspecified: Secondary | ICD-10-CM | POA: Diagnosis not present

## 2021-12-21 DIAGNOSIS — G479 Sleep disorder, unspecified: Secondary | ICD-10-CM | POA: Diagnosis not present

## 2022-01-18 DIAGNOSIS — G479 Sleep disorder, unspecified: Secondary | ICD-10-CM | POA: Diagnosis not present

## 2022-01-18 DIAGNOSIS — F39 Unspecified mood [affective] disorder: Secondary | ICD-10-CM | POA: Diagnosis not present

## 2022-01-18 DIAGNOSIS — R69 Illness, unspecified: Secondary | ICD-10-CM | POA: Diagnosis not present

## 2022-01-18 DIAGNOSIS — F429 Obsessive-compulsive disorder, unspecified: Secondary | ICD-10-CM | POA: Diagnosis not present

## 2022-02-05 DIAGNOSIS — G479 Sleep disorder, unspecified: Secondary | ICD-10-CM | POA: Diagnosis not present

## 2022-02-05 DIAGNOSIS — R69 Illness, unspecified: Secondary | ICD-10-CM | POA: Diagnosis not present

## 2022-02-05 DIAGNOSIS — F39 Unspecified mood [affective] disorder: Secondary | ICD-10-CM | POA: Diagnosis not present

## 2022-02-05 DIAGNOSIS — F341 Dysthymic disorder: Secondary | ICD-10-CM | POA: Diagnosis not present

## 2022-02-05 DIAGNOSIS — F429 Obsessive-compulsive disorder, unspecified: Secondary | ICD-10-CM | POA: Diagnosis not present

## 2022-03-22 DIAGNOSIS — R69 Illness, unspecified: Secondary | ICD-10-CM | POA: Diagnosis not present

## 2022-03-22 DIAGNOSIS — G479 Sleep disorder, unspecified: Secondary | ICD-10-CM | POA: Diagnosis not present

## 2022-03-22 DIAGNOSIS — F39 Unspecified mood [affective] disorder: Secondary | ICD-10-CM | POA: Diagnosis not present

## 2022-03-22 DIAGNOSIS — F429 Obsessive-compulsive disorder, unspecified: Secondary | ICD-10-CM | POA: Diagnosis not present

## 2022-03-22 DIAGNOSIS — F341 Dysthymic disorder: Secondary | ICD-10-CM | POA: Diagnosis not present

## 2022-12-24 ENCOUNTER — Other Ambulatory Visit: Payer: Self-pay

## 2022-12-24 ENCOUNTER — Encounter (HOSPITAL_COMMUNITY): Payer: Self-pay

## 2022-12-24 ENCOUNTER — Emergency Department (HOSPITAL_COMMUNITY)
Admission: EM | Admit: 2022-12-24 | Discharge: 2022-12-25 | Disposition: A | Discharge status: Other Acute Inpatient Hospital | Payer: 59 | Attending: Emergency Medicine | Admitting: Emergency Medicine

## 2022-12-24 DIAGNOSIS — F329 Major depressive disorder, single episode, unspecified: Secondary | ICD-10-CM | POA: Diagnosis not present

## 2022-12-24 DIAGNOSIS — E876 Hypokalemia: Secondary | ICD-10-CM | POA: Insufficient documentation

## 2022-12-24 DIAGNOSIS — G4700 Insomnia, unspecified: Secondary | ICD-10-CM | POA: Diagnosis not present

## 2022-12-24 DIAGNOSIS — Z79899 Other long term (current) drug therapy: Secondary | ICD-10-CM | POA: Insufficient documentation

## 2022-12-24 DIAGNOSIS — F29 Unspecified psychosis not due to a substance or known physiological condition: Secondary | ICD-10-CM | POA: Insufficient documentation

## 2022-12-24 DIAGNOSIS — F32A Depression, unspecified: Secondary | ICD-10-CM | POA: Diagnosis present

## 2022-12-24 HISTORY — DX: Bipolar disorder, unspecified: F31.9

## 2022-12-24 LAB — COMPREHENSIVE METABOLIC PANEL
ALT: 15 U/L (ref 0–44)
AST: 21 U/L (ref 15–41)
Albumin: 4.5 g/dL (ref 3.5–5.0)
Alkaline Phosphatase: 52 U/L (ref 38–126)
Anion gap: 11 (ref 5–15)
BUN: 7 mg/dL (ref 6–20)
CO2: 21 mmol/L — ABNORMAL LOW (ref 22–32)
Calcium: 9.1 mg/dL (ref 8.9–10.3)
Chloride: 104 mmol/L (ref 98–111)
Creatinine, Ser: 0.97 mg/dL (ref 0.44–1.00)
GFR, Estimated: >60 mL/min (ref >60)
Glucose, Bld: 170 mg/dL — ABNORMAL HIGH (ref 70–99)
Potassium: 3.1 mmol/L — ABNORMAL LOW (ref 3.5–5.1)
Sodium: 136 mmol/L (ref 135–145)
Total Bilirubin: 2.3 mg/dL — ABNORMAL HIGH (ref 0.3–1.2)
Total Protein: 7.7 g/dL (ref 6.5–8.1)

## 2022-12-24 LAB — RAPID URINE DRUG SCREEN, HOSP PERFORMED
Amphetamines: NOT DETECTED
Barbiturates: NOT DETECTED
Benzodiazepines: NOT DETECTED
Cocaine: NOT DETECTED
Opiates: NOT DETECTED
Tetrahydrocannabinol: NOT DETECTED

## 2022-12-24 LAB — CBC
HCT: 39.4 % (ref 36.0–46.0)
Hemoglobin: 13.3 g/dL (ref 12.0–15.0)
MCH: 28.9 pg (ref 26.0–34.0)
MCHC: 33.8 g/dL (ref 30.0–36.0)
MCV: 85.7 fL (ref 80.0–100.0)
Platelets: 185 10*3/uL (ref 150–400)
RBC: 4.6 MIL/uL (ref 3.87–5.11)
RDW: 13.8 % (ref 11.5–15.5)
WBC: 8.7 10*3/uL (ref 4.0–10.5)
nRBC: 0 % (ref 0.0–0.2)

## 2022-12-24 LAB — ETHANOL: Alcohol, Ethyl (B): <10 mg/dL (ref <10)

## 2022-12-24 LAB — SALICYLATE LEVEL: Salicylate Lvl: <7 mg/dL — ABNORMAL LOW (ref 7.0–30.0)

## 2022-12-24 LAB — HCG, SERUM, QUALITATIVE: Preg, Serum: NEGATIVE

## 2022-12-24 LAB — ACETAMINOPHEN LEVEL: Acetaminophen (Tylenol), Serum: <10 ug/mL — ABNORMAL LOW (ref 10–30)

## 2022-12-24 MED ORDER — ZOLPIDEM TARTRATE 5 MG PO TABS
5.0000 mg | ORAL_TABLET | Freq: Every evening | ORAL | Status: DC | PRN
  Administered 2022-12-24: 5 mg via ORAL
  Filled 2022-12-24: qty 1

## 2022-12-24 MED ORDER — POTASSIUM CHLORIDE CRYS ER 20 MEQ PO TBCR
40.0000 meq | EXTENDED_RELEASE_TABLET | Freq: Once | ORAL | Status: AC
  Administered 2022-12-24: 40 meq via ORAL
  Filled 2022-12-24: qty 2

## 2022-12-24 NOTE — ED Notes (Signed)
Mom took pts belongings home with her

## 2022-12-24 NOTE — ED Triage Notes (Addendum)
Patient is having hallucinations saying everyone she sees has 2 faces. Police were called on patient twice. Patients mom stated patient sat on a random persons porch one night. Patient said she does not want to hurt herself or anyone else. 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 Patient recently had a breakup 2 weeks ago and has been acting different since. Patient has not been to sleep since Friday.

## 2022-12-24 NOTE — BH Assessment (Signed)
TTS consult will be completed by [facility] provider. Provider and time pending. Secure chat initiated.

## 2022-12-24 NOTE — ED Provider Notes (Signed)
[place] [facility] AT [facility] Provider Note   CSN: [id number] Arrival date & time: 12/24/22  1720     History  Chief Complaint  Patient presents with   Mental Health Problem    Sarah Wade is a 22 y.o. female.  22 year old female who presents with insomnia as well as trouble recommending faces.  She denies any SI or HI.  Has not slept in several days.  Denies any illicit drug use.  Patient's mother is at bedside and helps with the history.  That everyone she sees has 2 faces.  She has called the police twice due to being fearful of this.       Home Medications Prior to Admission medications   Not on File      Allergies    Tylenol [acetaminophen] and Nickel    Review of Systems   Review of Systems  All other systems reviewed and are negative.   Physical Exam Updated Vital Signs BP 127/73 (BP Location: Left Arm)   Pulse (!) 122   Temp 98.8 F (37.1 C) (Oral)   Resp 18   Ht 1.626 m (5\' 4" )   Wt 52.3 kg   SpO2 100%   BMI 19.81 kg/m  Physical Exam Vitals and nursing note reviewed.  Constitutional:      General: She is not in acute distress.    Appearance: Normal appearance. She is well-developed. She is not toxic-appearing.  HENT:     Head: Normocephalic and atraumatic.  Eyes:     General: Lids are normal.     Conjunctiva/sclera: Conjunctivae normal.     Pupils: Pupils are equal, round, and reactive to light.  Neck:     Thyroid: No thyroid mass.     Trachea: No tracheal deviation.  Cardiovascular:     Rate and Rhythm: Normal rate and regular rhythm.     Heart sounds: Normal heart sounds. No murmur heard.    No gallop.  Pulmonary:     Effort: Pulmonary effort is normal. No respiratory distress.     Breath sounds: Normal breath sounds. No stridor. No decreased breath sounds, wheezing, rhonchi or rales.  Abdominal:     General: There is no distension.     Palpations: Abdomen is soft.     Tenderness: There is no abdominal  tenderness. There is no rebound.  Musculoskeletal:        General: No tenderness. Normal range of motion.     Cervical back: Normal range of motion and neck supple.  Skin:    General: Skin is warm and dry.     Findings: No abrasion or rash.  Neurological:     Mental Status: She is alert and oriented to person, place, and time. Mental status is at baseline.     GCS: GCS eye subscore is 4. GCS verbal subscore is 5. GCS motor subscore is 6.     Cranial Nerves: Cranial nerves are intact. No cranial nerve deficit.     Sensory: No sensory deficit.     Motor: Motor function is intact.  Psychiatric:        Attention and Perception: She is inattentive.        Mood and Affect: Affect is blunt and flat.        Speech: Speech is delayed.        Behavior: Behavior is slowed and withdrawn.        Thought Content: Thought content does not include homicidal or suicidal ideation.  ED Results / Procedures / Treatments   Labs (all labs ordered are listed, but only abnormal results are displayed) Labs Reviewed  COMPREHENSIVE METABOLIC PANEL - Abnormal; Notable for the following components:      Result Value   Potassium 3.1 (*)    CO2 21 (*)    Glucose, Bld 170 (*)    Total Bilirubin 2.3 (*)    All other components within normal limits  SALICYLATE LEVEL - Abnormal; Notable for the following components:   Salicylate Lvl <7.0 (*)    All other components within normal limits  ACETAMINOPHEN LEVEL - Abnormal; Notable for the following components:   Acetaminophen (Tylenol), Serum <10 (*)    All other components within normal limits  ETHANOL  CBC  RAPID URINE DRUG SCREEN, HOSP PERFORMED  HCG, SERUM, QUALITATIVE    EKG None  Radiology No results found.  Procedures Procedures    Medications Ordered in ED Medications - No data to display  ED Course/ Medical Decision Making/ A&P                                 Medical Decision Making Amount and/or Complexity of Data Reviewed Labs:  ordered.   Patient's labs significant for hypokalemia of 3.1 which was treated with 40 mill equivalents of oral potassium.  She is otherwise medically clear for psychiatric exposition.  Will consult behavioral health        Final Clinical Impression(s) / ED Diagnoses Final diagnoses:  None    Rx / DC Orders ED Discharge Orders     None         Lorre Nick, MD 12/24/22 [phone]

## 2022-12-25 ENCOUNTER — Encounter (HOSPITAL_COMMUNITY): Payer: Self-pay | Admitting: Nurse Practitioner

## 2022-12-25 ENCOUNTER — Other Ambulatory Visit: Payer: Self-pay

## 2022-12-25 ENCOUNTER — Inpatient Hospital Stay (HOSPITAL_COMMUNITY)
Admission: AD | Admit: 2022-12-25 | Discharge: 2023-01-01 | DRG: 885 | Disposition: A | Discharge status: Home / Self Care | Payer: 59 | Source: Intra-hospital | Attending: Psychiatry | Admitting: Psychiatry

## 2022-12-25 DIAGNOSIS — E876 Hypokalemia: Secondary | ICD-10-CM | POA: Diagnosis present

## 2022-12-25 DIAGNOSIS — F250 Schizoaffective disorder, bipolar type: Secondary | ICD-10-CM | POA: Diagnosis present

## 2022-12-25 DIAGNOSIS — F429 Obsessive-compulsive disorder, unspecified: Secondary | ICD-10-CM | POA: Diagnosis present

## 2022-12-25 DIAGNOSIS — Z79899 Other long term (current) drug therapy: Secondary | ICD-10-CM

## 2022-12-25 DIAGNOSIS — F3163 Bipolar disorder, current episode mixed, severe, without psychotic features: Secondary | ICD-10-CM | POA: Diagnosis present

## 2022-12-25 DIAGNOSIS — Z62810 Personal history of physical and sexual abuse in childhood: Secondary | ICD-10-CM | POA: Diagnosis not present

## 2022-12-25 DIAGNOSIS — Z9152 Personal history of nonsuicidal self-harm: Secondary | ICD-10-CM

## 2022-12-25 DIAGNOSIS — F4323 Adjustment disorder with mixed anxiety and depressed mood: Secondary | ICD-10-CM | POA: Diagnosis present

## 2022-12-25 DIAGNOSIS — R064 Hyperventilation: Secondary | ICD-10-CM | POA: Diagnosis not present

## 2022-12-25 DIAGNOSIS — F329 Major depressive disorder, single episode, unspecified: Secondary | ICD-10-CM | POA: Diagnosis not present

## 2022-12-25 DIAGNOSIS — Z818 Family history of other mental and behavioral disorders: Secondary | ICD-10-CM | POA: Diagnosis not present

## 2022-12-25 DIAGNOSIS — L309 Dermatitis, unspecified: Secondary | ICD-10-CM | POA: Diagnosis present

## 2022-12-25 DIAGNOSIS — Z5320 Procedure and treatment not carried out because of patient's decision for unspecified reasons: Secondary | ICD-10-CM | POA: Diagnosis present

## 2022-12-25 DIAGNOSIS — G4700 Insomnia, unspecified: Secondary | ICD-10-CM | POA: Diagnosis present

## 2022-12-25 LAB — POTASSIUM: Potassium: 3.3 mmol/L — ABNORMAL LOW (ref 3.5–5.1)

## 2022-12-25 MED ORDER — DIPHENHYDRAMINE HCL 25 MG PO CAPS: 50.0000 mg | ORAL_CAPSULE | Freq: Three times a day (TID) | ORAL | Status: DC | PRN

## 2022-12-25 MED ORDER — LORAZEPAM 2 MG/ML IJ SOLN: 2.0000 mg | Freq: Three times a day (TID) | INTRAMUSCULAR | Status: DC | PRN

## 2022-12-25 MED ORDER — HALOPERIDOL LACTATE 5 MG/ML IJ SOLN: 5.0000 mg | Freq: Three times a day (TID) | INTRAMUSCULAR | Status: DC | PRN

## 2022-12-25 MED ORDER — LORAZEPAM 1 MG PO TABS
2.0000 mg | ORAL_TABLET | Freq: Three times a day (TID) | ORAL | Status: DC | PRN
  Administered 2022-12-26: 2 mg via ORAL
  Filled 2022-12-25: qty 2

## 2022-12-25 MED ORDER — HYDROXYZINE HCL 25 MG PO TABS
25.0000 mg | ORAL_TABLET | Freq: Three times a day (TID) | ORAL | Status: DC | PRN
  Administered 2022-12-26: 25 mg via ORAL
  Filled 2022-12-25: qty 1

## 2022-12-25 MED ORDER — OLANZAPINE 5 MG PO TBDP: 5.0000 mg | ORAL_TABLET | Freq: Every day | ORAL | Status: DC

## 2022-12-25 MED ORDER — DIPHENHYDRAMINE HCL 50 MG/ML IJ SOLN: 50.0000 mg | Freq: Three times a day (TID) | INTRAMUSCULAR | Status: DC | PRN

## 2022-12-25 MED ORDER — ALUM & MAG HYDROXIDE-SIMETH 200-200-20 MG/5ML PO SUSP: 30.0000 mL | ORAL | Status: DC | PRN

## 2022-12-25 MED ORDER — MAGNESIUM HYDROXIDE 400 MG/5ML PO SUSP: 30.0000 mL | Freq: Every day | ORAL | Status: DC | PRN

## 2022-12-25 MED ORDER — POTASSIUM CHLORIDE CRYS ER 20 MEQ PO TBCR: 40.0000 meq | EXTENDED_RELEASE_TABLET | Freq: Once | ORAL | Status: DC

## 2022-12-25 MED ORDER — TRAZODONE HCL 50 MG PO TABS
50.0000 mg | ORAL_TABLET | Freq: Every evening | ORAL | Status: DC | PRN
  Administered 2022-12-28 – 2022-12-29 (×2): 50 mg via ORAL
  Filled 2022-12-25 (×2): qty 1

## 2022-12-25 MED ORDER — OLANZAPINE 5 MG PO TBDP
5.0000 mg | ORAL_TABLET | Freq: Every day | ORAL | Status: DC
  Administered 2022-12-25: 5 mg via ORAL
  Filled 2022-12-25 (×3): qty 1

## 2022-12-25 MED ORDER — ENSURE ENLIVE PO LIQD
237.0000 mL | Freq: Two times a day (BID) | ORAL | Status: DC
  Administered 2022-12-26 – 2023-01-01 (×8): 237 mL via ORAL
  Filled 2022-12-25 (×17): qty 237

## 2022-12-25 MED ORDER — HYDROXYZINE HCL 25 MG PO TABS: 25.0000 mg | ORAL_TABLET | Freq: Three times a day (TID) | ORAL | Status: DC | PRN

## 2022-12-25 MED ORDER — HALOPERIDOL 5 MG PO TABS
5.0000 mg | ORAL_TABLET | Freq: Three times a day (TID) | ORAL | Status: DC | PRN
  Administered 2022-12-26: 5 mg via ORAL
  Filled 2022-12-25: qty 1

## 2022-12-25 MED ORDER — OLANZAPINE 5 MG PO TBDP
5.0000 mg | ORAL_TABLET | Freq: Once | ORAL | Status: AC
  Administered 2022-12-25: 5 mg via ORAL
  Filled 2022-12-25: qty 1

## 2022-12-25 NOTE — ED Notes (Signed)
Patient has been alert.  The is disorganized. Patient is disoriented. Patient is not answering  questions appropriately.  Patient labile, anxious, crying, and yelling at times.  Patient reassured.

## 2022-12-25 NOTE — Progress Notes (Signed)
Admission Note: Patient is a 22 year old female admitted to the unit from [facility] under IVC for acute psychosis, paranoia, audiovisual hallucinations and self-injurious behaviors. Patient is alert and oriented to person.  Patient is disorganized with tangential thought process.  Unable to participate with admission process due to paranoia and refused to sign consent for treatment.  Stated we are going to send her to jail if she signs any document.  Stated she was named after the lockness monster.  Fixated on magical and horror movies.  Reports seeing white people with yellow eyes.  Believed they are demons.  Reports hearing voices telling her she is a bad person.  Stated she was bullied in school.  Skin assessment completed.  Abrasions noted on both sides of nose and between eye brows.  Patient stated she was trying to scrape off her tattoos.  No contraband found.  Patient oriented to the unit, staff and room.  Routine safety checks initiated.  Patient is safe on the unit.

## 2022-12-25 NOTE — Tx Team (Signed)
Initial Treatment Plan 12/25/2022 6:18 PM Sarah Wade [id number]    PATIENT STRESSORS: Financial difficulties   Health problems   Marital or family conflict   Medication change or noncompliance     PATIENT STRENGTHS: Communication skills  Supportive family/friends    PATIENT IDENTIFIED PROBLEMS: "I need help"  Paranoia  Psychosis  Audiovisual hallucinations  Insomnia  Disorganized  Self-injurious behaviors         DISCHARGE CRITERIA:  Adequate post-discharge living arrangements Motivation to continue treatment in a less acute level of care  PRELIMINARY DISCHARGE PLAN: Attend aftercare/continuing care group Outpatient therapy Return to previous living arrangement  PATIENT/FAMILY INVOLVEMENT: This treatment plan has been presented to and reviewed with the patient, Sarah Wade, and/or family member.  The patient and family have been given the opportunity to ask questions and make suggestions.  Clarene Critchley, RN 12/25/2022, 6:18 PM

## 2022-12-25 NOTE — BH Assessment (Addendum)
Comprehensive Clinical Assessment (CCA) Note  12/25/2022 Sarah Wade [id number] Disposition: Clinician dicussed patient care with Rockney Ghee, NP.  She recommends inpatient psychiatric care for patient.  Clinician informed RN Silvano Bilis of disposition recommendation via secure messaging   Pt has a hard time answering a question directly.  She talks metaphorically and in a abstract way.  Patient has avoidant eye contact.  She denies hallucinatiosn but later describes seeing her dead body in a closet.  Patient has poor self control and judgement.   Patient hs no current outpatient care.     Chief Complaint:  Chief Complaint  Patient presents with   Mental Health Problem   Visit Diagnosis: MDD     CCA Screening, Triage and Referral (STR)  Patient Reported Information How did you hear about [place]? Family/Friend  What Is the Reason for Your Visit/Call Today? Pt mother brought her to [facility].  Pt lives with her mother, sister, grandparents, two aunts and uncle and some cousins.  Pt feels that she does not belong and that people think she is mean because she does not interact.,  Pt initially had said she was seeing people with two faces but she means that metaphorically.  Pt appears to isolate herself because she won't have to interact with others and worry about whether people like her.  Patient talks about whether people like her.  She says "I should just stay away from people."  Patient denies any SI.  Patient says "I tried to water board myself" She says that this was yesterday she tried to also electocute myself.  She says tha tshe also went a couple of days without eating and drinking.   Pt talks about seeing her dead body in a closet.  Pt denies any HI.  Patient says she "I know that my head is in the couds and I feel sad and I cry."  Patinet denies any A/V hallucinations.  She says "everything is jumbled, I don't feel like I have any reality."   Patient denies regular use of marijuana or  ETOH.  Patient does do some self harm by scratching her skin with a knife and last did that 2 weeks ago.  Pt does not know if there are any guns in the home.  Pt has no outpatient care.  How Long Has This Been Causing You Problems? <Week  What Do You Feel Would Help You the Most Today? Treatment for Depression or other mood problem   Have You Recently Had Any Thoughts About Hurting Yourself? Yes  Are You Planning to Commit Suicide/Harm Yourself At This time? Yes   Flowsheet Row ED from 12/24/2022 in [facility] Department at [facility] Most recent reading at 12/24/2022  5:36 PM ED from 12/07/2021 in [facility] Emergency Department at [facility] Most recent reading at 12/07/2021  2:46 PM ED from 12/07/2021 in [facility] at [facility]) Most recent reading at 12/07/2021  1:46 PM  C-SSRS RISK CATEGORY No Risk No Risk No Risk       Have you Recently Had Thoughts About Hurting Someone Karolee Ohs? No  Are You Planning to Harm Someone at This Time? No  Explanation: Pt said that over the last day she has tried to "waterboard" herself, electrocute herself and starve herself.  Pt denies any HI.   Have You Used Any Alcohol or Drugs in the Past 24 Hours? No  What Did You Use and How Much? None   Do You Currently  Have a Therapist/Psychiatrist? No  Name of Therapist/Psychiatrist: Name of Therapist/Psychiatrist: None   Have You Been Recently Discharged From Any Office Practice or Programs? No  Explanation of Discharge From Practice/Program: No recent discharges.     CCA Screening Triage Referral Assessment Type of Contact: Tele-Assessment  Telemedicine Service Delivery:   Is this Initial or Reassessment? Is this Initial or Reassessment?: Initial Assessment  Date Telepsych consult ordered in [facility]:  Date Telepsych consult ordered in [facility]: 12/24/22  Time Telepsych consult ordered in [facility]:  Time Telepsych consult ordered in [facility]:  1853  Location of Assessment: [facility] ED  Provider Location: [facility] Assessment Services   Collateral Involvement: None at this time   Does Patient Have a Automotive engineer Guardian? No  Legal Guardian Contact Information: Pt has no legal guardian.  Copy of Legal Guardianship Form: -- (Pt has no legal guardian.)  Legal Guardian Notified of Arrival: -- (Pt has no legal guardian.)  Legal Guardian Notified of Pending Discharge: -- (Pt has no legal guardian.)  If Minor and Not Living with Parent(s), Who has Custody? Pt is an adult  Is CPS involved or ever been involved? Never  Is APS involved or ever been involved? Never   Patient Determined To Be At Risk for Harm To Self or Others Based on Review of Patient Reported Information or Presenting Complaint? Yes, for Self-Harm  Method: No Plan  Availability of Means: Has close by (Pt was going to drown herself.)  Intent: Vague intent or NA  Notification Required: No need or identified person  Additional Information for Danger to Others Potential: -- (Pt denies any HI.)  Additional Comments for Danger to Others Potential: Pt denies any HI.  Are There Guns or Other Weapons in Your Home? No  Types of Guns/Weapons: Patiient is not sure.  Are These Weapons Safely Secured?                            No  Who Could Verify You Are Able To Have These Secured: mother  Do You Have any Outstanding Charges, Pending Court Dates, Parole/Probation? Denies  Contacted To Inform of Risk of Harm To Self or Others: Other: Comment (N/A)    Does Patient Present under Involuntary Commitment? No    [place] of Residence: [place]   Patient Currently Receiving the Following Services: Not Receiving Services   Determination of Need: Urgent (48 hours)   Options For Referral: Inpatient Hospitalization     CCA Biopsychosocial Patient Reported Schizophrenia/Schizoaffective Diagnosis in Past: No   Strengths: Pt is willing to participate  in treatment   Mental Health Symptoms Depression:   Difficulty Concentrating; Hopelessness; Worthlessness; Increase/decrease in appetite; Tearfulness   Duration of Depressive symptoms:  Duration of Depressive Symptoms: Greater than two weeks   Mania:   None   Anxiety:    Difficulty concentrating; Restlessness; Tension; Sleep; Worrying   Psychosis:   Grossly disorganized speech   Duration of Psychotic symptoms:  Duration of Psychotic Symptoms: Greater than six months   Trauma:   None   Obsessions:   Good insight   Compulsions:   Good insight   Inattention:   N/A   Hyperactivity/Impulsivity:   None   Oppositional/Defiant Behaviors:   None   Emotional Irregularity:   Chronic feelings of emptiness   Other Mood/Personality Symptoms:   Not assessed    Mental Status Exam Appearance and self-care  Stature:   Average   Weight:  Average weight   Clothing:   Casual   Grooming:   Neglected   Cosmetic use:   None   Posture/gait:   Normal   Motor activity:   Not Remarkable   Sensorium  Attention:   Confused   Concentration:   Focuses on irrelevancies; Preoccupied   Orientation:   X5   Recall/memory:   Normal   Affect and Mood  Affect:   Anxious   Mood:   Anxious; Depressed   Relating  Eye contact:   Avoided   Facial expression:   Anxious; Depressed; Sad   Attitude toward examiner:   Guarded   Thought and Language  Speech flow:  Flight of Ideas; Garbled   Thought content:   Appropriate to Mood and Circumstances   Preoccupation:   Obsessions   Hallucinations:   None   Organization:   Disorganized; Irrelevant; Perseverations; Insurance underwriter of Knowledge:   Fair   Intelligence:   Average   Abstraction:   Abstract   Judgement:   Poor   Reality Testing:   Distorted   Insight:   Lacking   Decision Making:   Confused   Social Functioning  Social Maturity:   Isolates    Social Judgement:   Naive   Stress  Stressors:   Other (Comment) (unknown)   Coping Ability:   Overwhelmed; Exhausted   Skill Deficits:   Decision making; Interpersonal   Supports:   Family     Religion: Religion/Spirituality Are You A Religious Person?: No How Might This Affect Treatment?: no affect  Leisure/Recreation: Leisure / Recreation Do You Have Hobbies?: No  Exercise/Diet: Exercise/Diet Do You Exercise?: No Have You Gained or Lost A Significant Amount of Weight in the Past Six Months?: No Do You Follow a Special Diet?: No Do You Have Any Trouble Sleeping?: No   CCA Employment/Education Employment/Work Situation: Employment / Work Situation Employment Situation: Unemployed Patient's Job has Been Impacted by Current Illness: No Has Patient ever Been in Equities trader?: No  Education: Education Is Patient Currently Attending School?: No Last Grade Completed: 12 Did You Product manager?: No Did You Have An Individualized Education Program (IIEP): No Did You Have Any Difficulty At [employer]?: No Patient's Education Has Been Impacted by Current Illness: No   CCA Family/Childhood History Family and Relationship History: Family history Marital status: Single Does patient have children?: No  Childhood History:  Childhood History By whom was/is the patient raised?: Mother Did patient suffer any verbal/emotional/physical/sexual abuse as a child?: No (Pt cannot answer directly.) Did patient suffer from severe childhood neglect?: No Has patient ever been sexually abused/assaulted/raped as an adolescent or adult?: No (Pt cannot answer directly.) Was the patient ever a victim of a crime or a disaster?: No Witnessed domestic violence?:  (Pt cannot answer directly.) Has patient been affected by domestic violence as an adult?:  (Pt cannot answer directly.)       CCA Substance Use Alcohol/Drug Use: Alcohol / Drug Use Pain Medications: SEE  MAR Prescriptions: SEE MAR Over the Counter: SEE MAR History of alcohol / drug use?: No history of alcohol / drug abuse Longest period of sobriety (when/how long): N/A                         ASAM's:  Six Dimensions of Multidimensional Assessment  Dimension 1:  Acute Intoxication and/or Withdrawal Potential:      Dimension 2:  Biomedical Conditions and Complications:  Dimension 3:  Emotional, Behavioral, or Cognitive Conditions and Complications:     Dimension 4:  Readiness to Change:     Dimension 5:  Relapse, Continued use, or Continued Problem Potential:     Dimension 6:  Recovery/Living Environment:     ASAM Severity Score:    ASAM Recommended Level of Treatment:     Substance use Disorder (SUD)    Recommendations for Services/Supports/Treatments: Recommendations for Services/Supports/Treatments Recommendations For Services/Supports/Treatments: Inpatient Hospitalization  Discharge Disposition:    DSM5 Diagnoses: Patient Active Problem List   Diagnosis Date Noted   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

## 2022-12-25 NOTE — Progress Notes (Signed)
   12/25/22 2050  Psych Admission Type (Psych Patients Only)  Admission Status Involuntary  Psychosocial Assessment  Patient Complaints None  Eye Contact Brief  Facial Expression Flat  Affect Flat  Speech Elective mutism  Interaction Childlike  Motor Activity Slow  Appearance/Hygiene Unremarkable  Behavior Characteristics Guarded  Mood Apprehensive  Thought Process  Coherency Unable to assess (pt electively mute)  Content UTA  Delusions UTA  Perception UTA  Hallucination None reported or observed  Judgment Impaired  Confusion UTA  Danger to Self  Current suicidal ideation? Denies (head shaking in the negative)  Danger to Others  Danger to Others None reported or observed   Progress note   D: Pt seen in her room. Pt asked assessment questions but would not respond or look at this writer. Later, at med window, pt denies SI, HI, AVH by a negative shake of her head. Denies pain, anxiety and depression with shake of head but no audible response. No other concerns noted at this time.  A: Pt provided support and encouragement. Pt given scheduled medication as prescribed. PRNs as appropriate. Q15 min checks for safety.   R: Pt safe on the unit. Will continue to monitor.

## 2022-12-25 NOTE — ED Notes (Signed)
Patient off unit to [facility] per provider.   Patient alert, calm, cooperative, no s/s of distress at this time.  Patient  discharge information given to [employer] for transportation. Patient ambulated off unit, escorted and transported by [facility]

## 2022-12-25 NOTE — Consult Note (Signed)
  Patient anxiously yells out somebody's name.  She is anxious and states she is seeing Demons.  She reports yelling out so God will save her. Patient remains disorganized, covers her face not to see double faces in one person.  She is unable to sign self in to the hospital fpor treatment.  Provider will pursue IVC at this time. Provider called her Mother Anson [place] and she says patient had an episode like this in 2022 and was hospitalized at [facility] in [place].  She states patient was diagnosed with Bipolar disorder and that her sister, patient's aunt suffers from Bipolar disorder.  Patient is currently not taking any medications at this time.  Patient will be transferred to [facility] once IVC is completed. Diagnosis-Brief Psychotic d/o, R/O Bipolar disorder with Psychosis

## 2022-12-25 NOTE — Group Note (Signed)
Date:  12/25/2022 Time:  8:54 PM  Group Topic/Focus:  Wrap-Up Group:   The focus of this group is to help patients review their daily goal of treatment and discuss progress on daily workbooks.    Participation Level:  Did Not Attend  Participation Quality:   Did Not Attend  Affect:  Did Not Attend  Cognitive:  Did Not Attend  Insight: None  Engagement in Group:  Did Not Attend  Modes of Intervention:  Did Not Attend  Additional Comments:  Pt was encouraged to attend wrap up group but did not attend.  Felipa Furnace 12/25/2022, 8:54 PM

## 2022-12-25 NOTE — ED Notes (Signed)
Patient resting, breathe equal and unlabored.

## 2022-12-25 NOTE — ED Notes (Addendum)
After receiving the order for ambien and administered to patient,  she only slept about 2 hours. Then, she got up and ask for towels and wash cloths so that she could clean herself up. She also put on clean scrubs.   I notified TTS so her assessment could be completed. After the TTS assessment, the patient was very tearful and asked for a hug. I hugged her and comforted her. Then, she asked for a bible. I gave her a new bible from the ED. NT gave her nourishment and some sheets to color.   For the remaining shift, she has been awake and either reading or coloring. She has been calm and cooperative the remaining portion of the shift.

## 2022-12-25 NOTE — Progress Notes (Signed)
Pt has been accepted to [facility] TODAY 12/25/2022, pending EKG and improved K+. Bed assignment: 508-1  Pt meets inpatient criteria per Dahlia Byes, NP  Attending Physician will be Phineas Inches, MD  Report can be called to: - Adult unit: [phone]  Pt can arrive after pending items are received  Care Team Notified: [facility] Rona Ravens, RN, Dahlia Byes, NP, and Lum Babe, RN  [place], [place]  12/25/2022 11:19 AM

## 2022-12-25 NOTE — ED Notes (Signed)
IVC, 12/25/2022,13:30, case # [id number]

## 2022-12-25 NOTE — ED Notes (Signed)
Patient also states that she is hearing voices and she is requesting lexapro.

## 2022-12-26 ENCOUNTER — Encounter (HOSPITAL_COMMUNITY): Payer: Self-pay

## 2022-12-26 DIAGNOSIS — F250 Schizoaffective disorder, bipolar type: Secondary | ICD-10-CM | POA: Insufficient documentation

## 2022-12-26 MED ORDER — OLANZAPINE 10 MG PO TBDP
10.0000 mg | ORAL_TABLET | Freq: Two times a day (BID) | ORAL | Status: DC
  Administered 2022-12-26 – 2022-12-29 (×6): 10 mg via ORAL
  Filled 2022-12-26 (×8): qty 1

## 2022-12-26 MED ORDER — IBUPROFEN 400 MG PO TABS
400.0000 mg | ORAL_TABLET | Freq: Four times a day (QID) | ORAL | Status: DC | PRN
  Administered 2022-12-27: 400 mg via ORAL
  Filled 2022-12-26: qty 1

## 2022-12-26 MED ORDER — OLANZAPINE 5 MG PO TBDP
5.0000 mg | ORAL_TABLET | Freq: Two times a day (BID) | ORAL | Status: DC
  Filled 2022-12-26 (×4): qty 1

## 2022-12-26 NOTE — Progress Notes (Signed)
PRN vistaril given for anxiety. Patient reports "I need to go home". She reports feeling like people are talking about her. Patient states "I don't want to drink anything, eat anything, or see anything" and "is this medicine even real?". She then says she is hungry. Snack provided. Water provided. Patient was calmer when she went back to sit in her bed.

## 2022-12-26 NOTE — Progress Notes (Signed)
   12/26/22 0555  15 Minute Checks  Location Bedroom  Visual Appearance Calm  Behavior Sleeping  Sleep (Behavioral Health Patients Only)  Calculate sleep? (Click Yes once per 24 hr at 0600 safety check) Yes  Documented sleep last 24 hours 9.25

## 2022-12-26 NOTE — Progress Notes (Addendum)
   12/26/22 2100  Psych Admission Type (Psych Patients Only)  Admission Status Involuntary  Psychosocial Assessment  Patient Complaints Anxiety;Depression  Eye Contact Brief  Facial Expression Flat  Affect Preoccupied  Speech Logical/coherent  Interaction Cautious;Childlike  Motor Activity Slow  Appearance/Hygiene Unremarkable  Behavior Characteristics Anxious;Guarded  Mood Suspicious;Preoccupied;Depressed;Anxious  Thought Process  Coherency Disorganized  Content Paranoia;Preoccupation  Delusions Paranoid  Perception Hallucinations  Hallucination None reported or observed  Judgment Impaired  Confusion None  Danger to Self  Current suicidal ideation? Denies  Danger to Others  Danger to Others None reported or observed   Progress note   D: Pt seen at med window. Pt denies SI, HI, AVH. Pt rates pain  0/10. Pt rates anxiety  8/10 and depression  5/10. Says she is tired today. Pt believes that there is someone after her who is watching her. Believes she has seen the person on the unit. Assured pt that no unauthorized persons can enter the hospital or the unit. We are here to keep her safe. Pt still apprehensive. States last bowel movement was Saturday, 8/31. Denies bloating, pain or cramping. Says she normally eats fruits and vegetables but has not been eating much of that here. Encouraged fluids and more fruit especially at breakfast. Pt likes apples. Pt more verbal today. Still suspicious. No other concerns noted at this time.  A: Pt provided support and encouragement. Pt given scheduled medication as prescribed. PRNs as appropriate. Q15 min checks for safety.   R: Pt safe on the unit. Will continue to monitor.

## 2022-12-26 NOTE — Progress Notes (Signed)
Patient pushing the buttons on the wall a few times this morning. When asked what she needs patient states "can I have my medicine and go home?". This RN told patient the doctor is who decides when she goes home. This RN asked what medication the patient was wanting as there were none currently due. Patient responded "never mind" and covered herself up in bed.

## 2022-12-26 NOTE — Progress Notes (Signed)
   12/26/22 0900  Psych Admission Type (Psych Patients Only)  Admission Status Involuntary  Psychosocial Assessment  Patient Complaints Anxiety;Confusion;Disorientation;Hopelessness;Self-harm thoughts;Suspiciousness  Eye Contact Brief  Facial Expression Flat  Affect Flat  Speech Tangential  Interaction Childlike  Motor Activity Slow  Appearance/Hygiene Unremarkable  Behavior Characteristics Guarded;Anxious  Mood Apprehensive;Anxious;Suspicious  Thought Process  Coherency Disorganized  Content Paranoia;Preoccupation  Delusions Paranoid  Perception Hallucinations  Hallucination Auditory  Judgment Impaired  Confusion Mild  Danger to Self  Current suicidal ideation? Denies  Danger to Others  Danger to Others None reported or observed

## 2022-12-26 NOTE — Group Note (Signed)
Date:  12/26/2022 Time:  1:41 PM  Group Topic/Focus:  Emotional Education:   The focus of this group is to discuss what feelings/emotions are, and how they are experienced.    Participation Level:  Did Not Attend    Sarah Wade 12/26/2022, 1:41 PM

## 2022-12-26 NOTE — Group Note (Signed)
Date:  12/26/2022 Time:  10:25 AM  Group Topic/Focus:  Goals Group:   The focus of this group is to help patients establish daily goals to achieve during treatment and discuss how the patient can incorporate goal setting into their daily lives to aide in recovery.    Participation Level:  Did Not Attend   Donell Beers 12/26/2022, 10:25 AM

## 2022-12-26 NOTE — Progress Notes (Signed)
PRN ativan and haldol effective. Patient resting quietly in her bed.

## 2022-12-26 NOTE — BHH Suicide Risk Assessment (Signed)
[facility] Admission Suicide Risk Assessment   Nursing information obtained from:  Patient Demographic factors:  Adolescent or young adult, Unemployed, Low socioeconomic status Current Mental Status:  Self-harm behaviors Loss Factors:  Financial problems / change in socioeconomic status, Decline in physical health Historical Factors:  Impulsivity Risk Reduction Factors:  Positive social support  Total Time spent with patient: 45 minutes Principal Problem: Bipolar disorder, curr episode mixed, severe, w/o psychotic features (HCC) Diagnosis:  Principal Problem:   Bipolar disorder, curr episode mixed, severe, w/o psychotic features (HCC)  Subjective Data:  CC: "I don't like this game everyone's watching me"   Sarah Wade is a 21 y.o. female  with a past psychiatric history of schizoaffective disorder bipolar type, cannabis-induced psychosis. Patient initially arrived to [facility] on 9/2 for insomnia and trouble recognizing faces, and admitted to [facility] under IVC on 12/25/22 for acute psychosis. PPHx is significant for no history of Suicide Attempts, Self Injurious Behavior, and 2 Prior Psychiatric Hospitalizations (11/2018, [facility] 01/2021). PMHx is significant for eczema.    No current outpatient medications    PRN medication prior to evaluation: none   According to outside records, the patient presented to [facility] 9/2 with insomnia and trouble recognizing faces, mom reports everyone patient sees has 2 faces and patient has called the police twice due to being fearful of this. Had hypokalemia. In ED, patient was anxious and yelling out that she was seeing demons. Reported that she tried to electrocute herself and went a couple of days without eating and drinking. Saw a dead body in the closet. Slept for 2 hours with ambien. Not answering questions and yelling. Received zyprexa 5mg  and PRN ambien.   Per nursing on admission:  Patient is alert and oriented to person. Patient is disorganized with  tangential thought process. Unable to participate with admission process due to paranoia and refused to sign consent for treatment. Stated we are going to send her to jail if she signs any document. Stated she was named after the lockness monster. Fixated on magical and horror movies. Reports seeing white people with yellow eyes. Believed they are demons. Reports hearing voices telling her she is a bad person. Stated she was bullied in school.    Overnight:  Patient standing in bathroom with a fearful look on her face. This Clinical research associate asked if she was okay. Pt shook her head in the negative. "There's a man who's trying to get me and I don't want him to." Pt assured that no unauthorized person can gain access to the unit. "Can I put my clothes on? I don't feel right without them on. I'll put the scrubs over top of them." Pt told she could put her clothing on if she wants.    Chart review:  VSS. Compliant with meds. Placed on zyprexa 5mg  at bedtime. No PRNs. UDS negative. K 3.3. Scratched with nose on both sides. Reports she had a headache. Hiding around bathroom.  Slept 9.25 hours.    HPI:  Patient is seen sitting up in her bed with a shirt on top of her head and pulled down over her eyes. Reports "I don't like talking. I don't like talking. I don't like when people touch me. I don't like seeing anybody." Feels like people are touching me everywhere. "Everything is so loud." Reports hearing things. Reports many voices. Hard to hear. They're telling me to stay in my room, shut the door, not talk to anyone, not listen to anyone but just go to sleep.  Reports hearing voices forever. Reports unsure of when voices got louder.    Reports went to sleep. "Not sure what's going on. I don't know what's going on. There's something following me. It's a man. I don't know. Can't see him. I don't feel safe anywhere. Since I was little." Denies he's been closer recently.    Reports having difficulty sleeping. Unsure of how  hard it has been to sleep. Reports voices telling her to hurt herself. "I don't like it here everything's dirty." Reports someone's playing a trick on me I don't like it. "The follow trick." They are following me. They're making me itch. I don't want to hurt anybody.    "It's dirty everybody. I don't like this game everyone's watching me. I just want to read and go to sleep."    Reports "I ate the stupid food." Reports "I don't like being born. I don't like being here right now." When asked if could call her mom for collateral she reports "She doesn't want to talk right now. Because it's too dark. I tried to go away. I want to leave. Someone says I'm a bad person. I can't control it. I don't deserve to live. All I have to do is go away. If I go away everything's okay."    Reports "I can't see." Reports "I don't want to take this off. Everyone's going to say I'm ugly. I just want to go away so no one will laugh at me anymore. And no one will trick me anymore."    Psychiatric ROS Mood Symptoms Recently decreased sleep. Endorsing depressive symptoms of feeling guilty, thoughts of hurting herself. Feeling that she is ugly. Mom reports suicide note found on her phone written in August and patient appearing more depressed and withdrawn in the past month.    Manic Symptoms Mom reports over the past month patient did not appear to have elevated mood. She did not appear to be talking faster than normal. She had not noticed decreased sleep until the week before when her behaviors became more bizarre.    Anxiety Symptoms Patient reporting current anxiety due to feeling of being followed.    Trauma Symptoms Unable to assess.    Psychosis Symptoms Patient is having thoughts that someone is following her. She is reporting auditory, visual, and tactile hallucinations. She is having delusions of persecution.    Substance use (Frequency, quantity, last use, impact) Mom reports she is not aware of any recent  substance use. UDS negative.   Collateral information: mom Earnest Rosier Broke up with boyfriend July/August. Notice change after relationships. She stated that she was the one who broke it up with him. Started talking to someone else. Reports she was going to be gone this weekend with the guy. She came into mom's room Friday night. She wanted mom to meet the guy with her. She didn't look like she was going anywhere. She's sounding child-like. She hadn't been sleeping. She texted mom and told her to change phone number in middle of night. Asking if she's going to die. Asking lots of questions. Saturday night she left the house. Saw police in the neighborhood. Someone from neighborhood had called since she was seen sitting on someone's back porch, and then walked into forest. She's been talking about being a wolf and a lone wolf. At home, she went into closet and read the bible. She started piling up her clothes to burn. Then after mom packed up clothes, she started piling up more  clothes. Dad heard banging, she heard something fall from top of the stairs and something fell. But still "out of it." She stated she wanted to go iceskating, water in the room. Reports she is unhappy with the way she looks. Mom found suicide note on phone written 8/3. Reports went on trip August 15th. Abnormal behavior started within last week. Baseline she's quiet, reserved. Acts older than what she is. Does speak to herself a lot, will not be as social with family. Does not appear to respond to internal stimuli.    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 Accepted to [facility] for inpatient psychiatric treatment.   Psychotherapy hx: [facility] history: None History of suicide (obtained from HPI): history of self-harm, scratch skin with knife 2 weeks ago. Denies suicide attempts. Reports found suicide note in her phone about putting gasoline on herself and lighting herself on fire. Document cutting leg.  History of homicide or aggression (obtained in HPI):   Substance Abuse Hx: (Frequency, quantity, last use, impact) Alcohol: mom reports occasional  Tobacco: denies Cannabis: denies Other Illicit drugs: denies Rx drug abuse: denies Rehab hx: denies   Past Medical History: PCP: unknown Medical Dx: hemorrhagic cyst of R ovary, Eczema Medications: none Allergies: tylenol causes swelling, nickel causes rash Hospitalizations: none Surgeries: none Trauma: none Seizures: Denies   Contraceptives: Reports she goes to OB, unsure about contraception   Family Medical History: History reviewed. No pertinent family history.        Family Psychiatric History: Psychiatric Dx: Maternal aunt with ADHD, bipolar. Mom with depression. Denies family history of schizophrenia.  Suicide Hx: Reports a cousin with children who committed suicide.  Violence/Aggression: Substance use: reports father an alcoholic   Social History: Living Situation: lives with mother, 1 sister (16yo), grandparents Education: Completed high school Occupational hx: unemployed, stopped working around May/June Marital Status: single Children: none Legal: none Military: none   Access to firearms: reports grandfather's are locked up.      Total Time spent with patient: 45 minutes  Continued Clinical Symptoms:    The "Alcohol Use  Disorders Identification Test", Guidelines for Use in Primary Care, Second Edition.  World Science writer [facility]). Score between 0-7:  no or low risk or alcohol related problems. Score between 8-15:  moderate risk of alcohol related problems. Score between 16-19:  high risk of alcohol related problems. Score 20 or above:  warrants further diagnostic evaluation for alcohol dependence and treatment.   CLINICAL FACTORS:   Currently Psychotic  Musculoskeletal: Strength & Muscle Tone: within normal limits Gait & Station: normal Patient leans: N/A  Psychiatric Specialty Exam See H&P  Assets  Assets: Housing, social support, resilience  Sleep  Sleep: 9.25 hours  Physical Exam: See H&P  Review of Systems  See H&P  Blood pressure 103/72, pulse 98, temperature 98.1 F (36.7 C), temperature source Oral, resp. rate 16, height 5\' 4"  (1.626 m), weight 50.8 kg, SpO2 100%. Body mass index is 19.22 kg/m.   COGNITIVE FEATURES THAT CONTRIBUTE TO RISK:  Loss of executive function  SUICIDE RISK:  Moderate:  Frequent suicidal ideation with limited intensity, and duration, some specificity in terms of plans, no associated intent, good self-control, limited dysphoria/symptomatology, some risk factors present, and identifiable protective factors, including available and accessible social support.  PLAN OF CARE: See H&P  ASSESSMENT: See H&P  I certify that inpatient services furnished can reasonably be expected to improve the patient's condition.   Karie Fetch, MD, PGY-2 12/26/2022, 7:21 AM

## 2022-12-26 NOTE — Progress Notes (Signed)
Pt wants to know if anyone can see her through her clothing. Assured pt that no one can see her through her clothing. Did allow this writer to assess her vital signs in the room.

## 2022-12-26 NOTE — Progress Notes (Signed)
Patient refused labs this evening.

## 2022-12-26 NOTE — Progress Notes (Signed)
Pt asked to get up for vital signs but has not gotten up yet. Went to see if pt was still making her way to the dayroom. Pt standing in bathroom with a fearful look on her face. This Clinical research associate asked if she was okay. Pt shook her head in the negative. "There's a man who's trying to get me and I don't want him to." Pt assured that no unauthorized person can gain access to the unit. "Can I put my clothes on? I don't feel right without them on. I'll put the scrubs over top of them." Pt told she could put her clothing on if she wants. Asked pt to come to dayroom to have VS assessed after getting dressed.

## 2022-12-26 NOTE — Group Note (Signed)
Recreation Therapy Group Note   Group Topic:Self-Esteem  Group Date: 12/26/2022 Start Time: 1015 End Time: 1050 Facilitators: Kimbree Casanas-McCall, LRT,CTRS Location: [address] Dayroom   Goal Area(s) Addresses:  Patient will successfully identify positive attributes about themselves.  Patient will identify healthy ways to increase self-esteem. Patient will acknowledge benefit(s) of improved self-esteem.   Group Description: Patients were given a worksheet in which they were to identify their strengths and qualities. As patients worked on [employer], they were also given the opportunity to request songs to be played during group session as long as the songs were clean and appropriate.  Education:  Self-Esteem, Discharge Planning  Education Outcome: Acknowledges education/In group clarification offered/Needs additional education   Affect/Mood: N/A   Participation Level: Did not attend    Clinical Observations/Individualized Feedback:     Plan: Continue to engage patient in RT group sessions 2-3x/week.   Magalene Mclear-McCall, LRT,CTRS  12/26/2022 12:33 PM

## 2022-12-26 NOTE — Progress Notes (Signed)
Pt did not attend group. 

## 2022-12-26 NOTE — BH IP Treatment Plan (Signed)
Interdisciplinary Treatment and Diagnostic Plan Initial  12/26/2022 Time of Session: 1130 Sarah Wade MRN: [id number]  Principal Diagnosis: Bipolar disorder, curr episode mixed, severe, w/o psychotic features (HCC)  Secondary Diagnoses: Principal Problem:   Bipolar disorder, curr episode mixed, severe, w/o psychotic features (HCC)   Current Medications:  Current Facility-Administered Medications  Medication Dose Route Frequency Provider Last Rate Last Admin   alum & mag hydroxide-simeth (MAALOX/MYLANTA) 200-200-20 MG/5ML suspension 30 mL  30 mL Oral Q4H PRN Onuoha, Josephine C, NP       diphenhydrAMINE (BENADRYL) capsule 50 mg  50 mg Oral TID PRN Earney Navy, NP       Or   diphenhydrAMINE (BENADRYL) injection 50 mg  50 mg Intramuscular TID PRN Earney Navy, NP       feeding supplement (ENSURE ENLIVE / ENSURE PLUS) liquid 237 mL  237 mL Oral BID BM Massengill, Nathan, MD       haloperidol (HALDOL) tablet 5 mg  5 mg Oral TID PRN Dahlia Byes C, NP       Or   haloperidol lactate (HALDOL) injection 5 mg  5 mg Intramuscular TID PRN Earney Navy, NP       hydrOXYzine (ATARAX) tablet 25 mg  25 mg Oral TID PRN Dahlia Byes C, NP       ibuprofen (ADVIL) tablet 400 mg  400 mg Oral Q6H PRN Massengill, Nathan, MD       LORazepam (ATIVAN) tablet 2 mg  2 mg Oral TID PRN Dahlia Byes C, NP       Or   LORazepam (ATIVAN) injection 2 mg  2 mg Intramuscular TID PRN Dahlia Byes C, NP       magnesium hydroxide (MILK OF MAGNESIA) suspension 30 mL  30 mL Oral Daily PRN Dahlia Byes C, NP       OLANZapine zydis (ZYPREXA) disintegrating tablet 5 mg  5 mg Oral Q12H Karie Fetch, MD       traZODone (DESYREL) tablet 50 mg  50 mg Oral QHS PRN Dahlia Byes C, NP       PTA Medications: No medications prior to admission.    Patient Stressors: Financial difficulties   Health problems   Marital or family conflict   Medication change or noncompliance     Patient Strengths: Manufacturing systems engineer  Supportive family/friends   Treatment Modalities: Medication Management, Group therapy, Case management,  1 to 1 session with clinician, Psychoeducation, Recreational therapy.   Physician Treatment Plan for Primary Diagnosis: Bipolar disorder, curr episode mixed, severe, w/o psychotic features (HCC) Long Term Goal(s):     Short Term Goals:    Medication Management: Evaluate patient's response, side effects, and tolerance of medication regimen.  Therapeutic Interventions: 1 to 1 sessions, Unit Group sessions and Medication administration.  Evaluation of Outcomes: Progressing  Physician Treatment Plan for Secondary Diagnosis: Principal Problem:   Bipolar disorder, curr episode mixed, severe, w/o psychotic features (HCC)  Long Term Goal(s):   Counseling Therapy  Short Term Goals:     Medication Management  Medication Management: Evaluate patient's response, side effects, and tolerance of medication regimen.  Therapeutic Interventions: 1 to 1 sessions, Unit Group sessions and Medication administration.  Evaluation of Outcomes: Progressing   RN Treatment Plan for Primary Diagnosis: Bipolar disorder, curr episode mixed, severe, w/o psychotic features (HCC) Long Term Goal(s): Knowledge of disease and therapeutic regimen to maintain health will improve  Short Term Goals: Ability to remain free from injury will improve, Ability to verbalize frustration  and anger appropriately will improve, Ability to demonstrate self-control, Ability to participate in decision making will improve, Ability to verbalize feelings will improve, Ability to disclose and discuss suicidal ideas, Ability to identify and develop effective coping behaviors will improve, and Compliance with prescribed medications will improve  Medication Management: RN will administer medications as ordered by provider, will assess and evaluate patient's response and provide education to  patient for prescribed medication. RN will report any adverse and/or side effects to prescribing provider.  Therapeutic Interventions: 1 on 1 counseling sessions, Psychoeducation, Medication administration, Evaluate responses to treatment, Monitor vital signs and CBGs as ordered, Perform/monitor CIWA, COWS, AIMS and Fall Risk screenings as ordered, Perform wound care treatments as ordered.  Evaluation of Outcomes: Progressing   LCSW Treatment Plan for Primary Diagnosis: Bipolar disorder, curr episode mixed, severe, w/o psychotic features (HCC) Long Term Goal(s): Safe transition to appropriate next level of care at discharge, Engage patient in therapeutic group addressing interpersonal concerns.  Short Term Goals: Engage patient in aftercare planning with referrals and resources, Increase social support, Increase ability to appropriately verbalize feelings, Increase emotional regulation, Facilitate acceptance of mental health diagnosis and concerns, Facilitate patient progression through stages of change regarding substance use diagnoses and concerns, Identify triggers associated with mental health/substance abuse issues, and Increase skills for wellness and recovery  Therapeutic Interventions: Assess for all discharge needs, 1 to 1 time with Social worker, Explore available resources and support systems, Assess for adequacy in community support network, Educate family and significant other(s) on suicide prevention, Complete Psychosocial Assessment, Interpersonal group therapy.  Evaluation of Outcomes: Progressing   Progress in Treatment: Attending groups: No. Participating in groups: No. Taking medication as prescribed: Yes. Toleration medication: Yes. Family/Significant other contact made: No, will contact:  Consent Pending Patient understands diagnosis: No. Discussing patient identified problems/goals with staff: Yes. Medical problems stabilized or resolved: Yes. Denies suicidal/homicidal  ideation: Yes. Issues/concerns per patient self-inventory: Yes. Other: N/A  New problem(s) identified: No, Describe:  None Reported  New Short Term/Long Term Goal(s):  Patient Goals:  "Improve Self Image  Discharge Plan or Barriers: :  Patient recently admitted. CSW will continue to follow and assess for appropriate referrals and possible discharge planning.   Reason for Continuation of Hospitalization: Delusions  Depression Hallucinations Medication stabilization Suicidal ideation  Estimated Length of Stay: 5-7 Days  Last 3 [place] Suicide Severity Risk Score: Flowsheet Row [facility] ([facility] 12[facility]24/2022 in [facility] Department at [facility] ED from 12/07/2021 in [facility] Emergency Department at [facility]  C-SSRS RISK CATEGORY No Risk No Risk No Risk       Last PHQ 2/9 Scores:    06/09/2018    9:50 AM 02/19/2017    9:09 AM  Depression screen PHQ 2/9  Decreased Interest 1 3  Down, Depressed, Hopeless 1 3  PHQ - 2 Score 2 6  Altered sleeping 0 3  Tired, decreased energy 1 3  Change in appetite 0 3  Feeling bad or failure about yourself  1 3  Trouble concentrating 0 1  Moving slowly or fidgety/restless 1 3  PHQ-9 Score 5 22     medication stabilization, elimination of SI thoughts, development of comprehensive mental wellness plan.   Scribe for Treatment Team: Ane Payment, LCSW 12/26/2022 11:37 AM
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
## 2022-12-27 DIAGNOSIS — F250 Schizoaffective disorder, bipolar type: Secondary | ICD-10-CM | POA: Diagnosis not present

## 2022-12-27 NOTE — Progress Notes (Signed)
   12/27/22 1950  Psych Admission Type (Psych Patients Only)  Admission Status Involuntary  Psychosocial Assessment  Patient Complaints Anxiety  Eye Contact Brief  Facial Expression Flat  Affect Preoccupied  Speech Logical/coherent  Interaction Guarded;Cautious  Motor Activity Slow  Appearance/Hygiene Unremarkable  Behavior Characteristics Cooperative;Anxious  Mood Preoccupied;Anxious;Sad  Thought Ship broker  Content Preoccupation;Paranoia;Delusions  Delusions Persecutory;Paranoid  Perception WDL  Hallucination None reported or observed  Judgment Impaired  Confusion None  Danger to Self  Current suicidal ideation? Denies  Danger to Others  Danger to Others None reported or observed   Progress note   D: Pt seen in her room. Pt denies SI, HI, AVH. Pt rates pain  0/10. Pt rates anxiety  3/10 and depression  0/10. Pt still believes people are trying to harm her. Talks about seeing pictures online of someone she thinks looks like her but it scares her. Pointed out that this picture is not her. "Why do people not like my looks? They tell me that I have a witches nose, that I am too skinny, that I have pimples on my face." Asked pt what does she think about herself. "I am a beautiful child created by God." Encouraged pt to hold on to her vision of herself and not what others say. Pt wanted to know why she doesn't fit in. "I try to get people to like me and fit in." Asked pt what she likes to do. Pt brightened up and listed several things. Encouraged pt to lean into those things that she likes to do and people with similar interests will find her. Pt liked this idea. Came to group tonight. Mother visited her also this evening. No other concerns noted at this time.  A: Pt provided support and encouragement. Pt given scheduled medication as prescribed. PRNs as appropriate. Q15 min checks for safety.   R: Pt safe on the unit. Will continue to monitor.

## 2022-12-27 NOTE — Plan of Care (Signed)
Problem: Activity: Goal: Interest or engagement in activities will improve Outcome: Progressing   Problem: Safety: Goal: Periods of time without injury will increase Outcome: Progressing  Pt A & O to self and place. Denies SI, HI, AVH and pain when assessed "Not right now". Noted with blank affect, stares, slow gait and paranoid ideations on interactions. Believes staff are trying to get her infected with cancer "I don't trust what you're giving me right now. Are these meds going to give me cancer because that's what you're doing to me right now".  Isolative, guarded to room majority of this shift. Tolerated meals, fluids and medications when offered without discomfort. Pt's concerns validated, reassurance, support and encouragement offered. Safety check maintained at Q 15 minutes intervals without incident.

## 2022-12-27 NOTE — Progress Notes (Signed)
   12/27/22 0559  15 Minute Checks  Location Bedroom  Visual Appearance Calm  Behavior Sleeping  Sleep (Behavioral Health Patients Only)  Calculate sleep? (Click Yes once per 24 hr at 0600 safety check) Yes  Documented sleep last 24 hours 7.25

## 2022-12-27 NOTE — Progress Notes (Signed)
Recreation Therapy Notes  1100: LRT attempted to complete assessment with patient. Patient was asleep facing the window. LRT will attempt to complete assessment at a later time.   Hubert Raatz-McCall, LRT,CTRS Kenslee Achorn A Ercell Razon-McCall 12/27/2022 12:40 PM

## 2022-12-27 NOTE — Group Note (Signed)
Date:  12/27/2022 Time:  1:24 PM  Group Topic/Focus:  Goals Group:   The focus of this group is to help patients establish daily goals to achieve during treatment and discuss how the patient can incorporate goal setting into their daily lives to aide in recovery.    Participation Level:  Did Not Attend   Donell Beers 12/27/2022, 1:24 PM

## 2022-12-27 NOTE — Group Note (Signed)
Occupational Therapy Group Note  Group Topic:Coping Skills  Group Date: 12/27/2022 Start Time: 1430 End Time: 1500 Facilitators: Ted Mcalpine, OT   Group Description: Group encouraged increased engagement and participation through discussion and activity focused on "Coping Ahead." Patients were split up into teams and selected a card from a stack of positive coping strategies. Patients were instructed to act out/charade the coping skill for other peers to guess and receive points for their team. Discussion followed with a focus on identifying additional positive coping strategies and patients shared how they were going to cope ahead over the weekend while continuing hospitalization stay.  Therapeutic Goal(s): Identify positive vs negative coping strategies. Identify coping skills to be used during hospitalization vs coping skills outside of hospital/at home Increase participation in therapeutic group environment and promote engagement in treatment   Participation Level: Did not attend                              Plan: Continue to engage patient in OT groups 2 - 3x/week.  12/27/2022  Ted Mcalpine, OT  Kerrin Champagne, OT
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
## 2022-12-27 NOTE — Group Note (Signed)
Recreation Therapy Group Note   Group Topic:Healthy Decision Making  Group Date: 12/27/2022 Start Time: 1000 End Time: 1030 Facilitators: Etha Stambaugh-McCall, LRT,CTRS Location: [address] Day[address]   Goal Area(s) Addresses:  Patient will effectively work with peer towards shared goal.  Patient will identify factors that guided their decision making.  Patient will pro-socially communicate ideas during group session.   Intervention: Survival Scenario - pencil, paper  Group Description: Patients were given a scenario that they were going to be stranded on a deserted [place] for several months before being rescued. Writer tasked them with making a list of 15 things they would choose to bring with them for "survival". The list of items was prioritized most important to least. Each patient would come up with their own list, then work together to create a new list of 15 items while in a group of 3-5 peers. LRT discussed each person's list and how it differed from others. The debrief included discussion of priorities, good decisions versus bad decisions, and how it is important to think before acting so we can make the best decision possible. LRT tied the concept of effective communication among group members to patient's support systems outside of the hospital and its benefit post discharge.  Clinical Observations/Individualized Feedback: Group did not occur due to LRT completing yearly competencies for work role.   Plan: Continue to engage patient in RT group sessions 2-3x/week.   Kenyotta Dorfman-McCall, LRT,CTRS 12/27/2022 12:31 PM

## 2022-12-28 DIAGNOSIS — F250 Schizoaffective disorder, bipolar type: Secondary | ICD-10-CM | POA: Diagnosis not present

## 2022-12-28 MED ORDER — POLYETHYLENE GLYCOL 3350 17 G PO PACK
17.0000 g | PACK | Freq: Every day | ORAL | Status: DC
  Administered 2022-12-28 – 2023-01-01 (×4): 17 g via ORAL
  Filled 2022-12-28 (×7): qty 1

## 2022-12-28 MED ORDER — FLUOXETINE HCL 10 MG PO CAPS
10.0000 mg | ORAL_CAPSULE | Freq: Every day | ORAL | Status: DC
  Administered 2022-12-28 – 2022-12-31 (×4): 10 mg via ORAL
  Filled 2022-12-28 (×5): qty 1

## 2022-12-28 NOTE — Progress Notes (Addendum)
[facility] MD Progress Note  12/28/2022 12:04 PM [name]  MRN:  [id number]  Principal Problem: Bipolar disorder, curr episode mixed, severe, w/o psychotic features (HCC) Diagnosis: Active Problems:   Schizoaffective disorder, bipolar type (HCC)   Reason for Admission:  Sarah Wade is a 22 y.o. female  with a past psychiatric history of schizoaffective disorder bipolar type, cannabis-induced psychosis. Patient initially arrived to [facility] on 9/2 for insomnia and trouble recognizing faces, and admitted to [facility] under IVC on 12/25/22 for acute psychosis. PPHx is significant for no history of Suicide Attempts, Self Injurious Behavior, and 2 Prior Psychiatric Hospitalizations (11/2018, [facility] 01/2021). PMHx is significant for eczema.   (admitted on 12/25/2022, total  LOS: 3 days )  Yesterday, the psychiatry team made following recommendations:  -Continue zyprexa 10mg  Q12H for psychosis   Chart review: Overnight Events BP 113/75. HR 96. Compliant with medications. No PRNs used. Slept 5.5 hours.   Pt rates pain 0/10. Pt rates anxiety 3/10 and depression 0/10. Pt still believes people are trying to harm her. Talks about seeing pictures online of someone she thinks looks like her but it scares her. Pointed out that this picture is not her. "Why do people not like my looks? They tell me that I have a witches nose, that I am too skinny, that I have pimples on my face." Asked pt what does she think about herself. "I am a beautiful child created by God." Encouraged pt to hold on to her vision of herself and not what others say. Pt wanted to know why she doesn't fit in. "I try to get people to like me and fit in." Asked pt what she likes to do. Pt brightened up and listed several things. Encouraged pt to lean into those things that she likes to do and people with similar interests will find her. Pt liked this idea. Came to group tonight. Mother visited her also this evening. No other concerns noted at this  time.   Pertinent information discussed during bed progression:  Case was discussed in the multidisciplinary team. MAR was reviewed and patient was compliant with medications.  Slept 5.5. Preoccupied with feeling ugly. Paranoid. Today she's not talking about getting poisoned with her medication.   Information Obtained Today During Patient Interview:  Reports not doing the best. Wants to go home. Reports having a hard time sleeping. Feels like "in this world no matter what I do, nobody will see me. Too much going on in my head."  Feel a little more tired today. Reports confused. Asks if she can take off her head turban. Confused about what's going on. "I keep on having nightmares." Reports having nightmares about not being human. Reports she is "an object, animal. Feels like that is how everyone sees me."  Reports no issues with appetite. Reports not hungry. For breakfast ate french toast, eggs, banana, fruit punch, chocolate milk, bacon.  Mood is "not good. Keep thinking something bad is about to happen. Last time I came here, I got help. Now it feels different. Feels like I have no one to talk to and I'm tired. Can't tell reality from fake." Reports seeing a lot of trees that don't move in my head. Reports still seeing colors. "I need medicine but I feel like it's going to kill me." Reports feeling like "somebody's messing with me. I always feel like that. On my phone, someone is messing with me on my phone and playing some game with my head, started when I  was 15. Might've met them on the bus in 11th grade. Making me feel like I'm some type of demon. I don't know what's going on if I'm being sex trafficked. There's these people that are messing with me. Playing with my desire to have a husband, boyfriend. The second to last thing I heard from them was I'm going to go black and not go back. Then I looked in the mirror and I was black blob with no eyes."  Reports visit with mom went well.  Denies physical  complaints. Denies SI/HI. Reports don't hear any voices.  Reports last BM was Saturday. Used to go every day.I need. Feels like something's trying to control me with the nightmares. Encouraged patient willingness to get labs drawn today. Discussed starting prozac.    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 Accepted to [facility] for inpatient psychiatric treatment.   Psychotherapy hx: [facility] history: None History of suicide (obtained from HPI): history of self-harm, scratch skin with knife 2 weeks ago. Denies suicide attempts. Reports found suicide note in her phone about putting gasoline on herself and lighting herself on fire. Document cutting leg.  History of homicide or aggression (obtained in HPI): none   Substance Abuse Hx: (Frequency, quantity, last use, impact) Alcohol: mom reports occasional  Tobacco: denies Cannabis: denies Other Illicit drugs: denies Rx drug abuse: denies Rehab hx: denies   Past Medical History: PCP: unknown Medical Dx: hemorrhagic cyst of R ovary, Eczema Medications: none Allergies: tylenol causes swelling, nickel causes rash Hospitalizations: none Surgeries: none Trauma: none Seizures: Denies   Contraceptives: Reports she goes to OB, unsure about contraception  Past Medical History:  Past Medical History:  Diagnosis Date   Bipolar affective  (HCC)    Family History: History reviewed. No pertinent family history.  Current Medications: Current Facility-Administered Medications  Medication Dose Route Frequency Provider Last Rate Last Admin   alum & mag hydroxide-simeth (MAALOX/MYLANTA) 200-200-20 MG/5ML suspension 30 mL  30 mL Oral Q4H PRN Onuoha, Josephine C, NP       diphenhydrAMINE (BENADRYL) capsule 50 mg  50 mg Oral TID PRN Dahlia Byes C, NP       Or   diphenhydrAMINE (BENADRYL) injection 50 mg  50 mg Intramuscular TID PRN Dahlia Byes C, NP       feeding supplement (ENSURE ENLIVE / ENSURE PLUS) liquid 237 mL  237 mL Oral BID BM Massengill, Nathan, MD   237 mL at 12/26/22 1641   FLUoxetine (PROZAC) capsule 10 mg  10 mg Oral Daily Karie Fetch, MD       haloperidol (HALDOL) tablet 5 mg  5 mg Oral TID PRN Dahlia Byes C, NP   5 mg at 12/26/22 1239   Or   haloperidol lactate (HALDOL) injection 5 mg  5 mg Intramuscular TID PRN Dahlia Byes C, NP       hydrOXYzine (ATARAX) tablet 25 mg  25 mg Oral TID PRN Dahlia Byes C, NP   25 mg at 12/26/22 1139   ibuprofen (ADVIL) tablet 400 mg  400 mg Oral  Q6H PRN Phineas Inches, MD   400 mg at 12/27/22 1610   LORazepam (ATIVAN) tablet 2 mg  2 mg Oral TID PRN Dahlia Byes C, NP   2 mg at 12/26/22 1239   Or   LORazepam (ATIVAN) injection 2 mg  2 mg Intramuscular TID PRN Dahlia Byes C, NP       magnesium hydroxide (MILK OF MAGNESIA) suspension 30 mL  30 mL Oral Daily PRN Dahlia Byes C, NP       OLANZapine zydis (ZYPREXA) disintegrating tablet 10 mg  10 mg Oral Q12H Massengill, Nathan, MD   10 mg at 12/28/22 0818   polyethylene glycol (MIRALAX / GLYCOLAX) packet 17 g  17 g Oral Daily Karie Fetch, MD       traZODone (DESYREL) tablet 50 mg  50 mg Oral QHS PRN Earney Navy, NP        Lab Results:  No results found for this or any previous visit (from the past 48 hour(s)).   Blood Alcohol level:  Lab Results  Component Value Date    ETH <10 12/24/2022   ETH <10 02/06/2021    Metabolic Labs: Lab Results  Component Value Date   HGBA1C 5.1 02/06/2021   MPG 99.67 02/06/2021   No results found for: "PROLACTIN" Lab Results  Component Value Date   CHOL 199 02/06/2021   TRIG 30 02/06/2021   HDL 83 02/06/2021   CHOLHDL 2.4 02/06/2021   VLDL 6 02/06/2021   LDLCALC 110 (H) 02/06/2021    Sleep:Sleep: Fair Number of Hours of Sleep: 5.5   Physical Findings: AIMS:  Facial and Oral Movements Muscles of Facial Expression: None, normal Lips and Perioral Area: None, normal Jaw: None, normal Tongue: None, normal,Extremity Movements Upper (arms, wrists, hands, fingers): None, normal Lower (legs, knees, ankles, toes): None, normal Trunk Movements: None, normal  Neck, shoulders, hips: None, normal Overall Severity Severity of abnormal movements (highest score from questions above): None, normal Incapacitation due to abnormal movements: None, normal Patient's awareness of abnormal movements (rate only patient's report): No Awareness, Dental Status Current problems with teeth and/or dentures?: No Does patient usually wear dentures?: No   No stiffness, cogwheeling, or tremors noted on exam.   CIWA:    COWS:     Psychiatric Specialty Exam:  Presentation  General Appearance: Casual  Eye Contact:Fair  Speech:Normal Rate  Speech Volume:Decreased  Handedness:Right   Mood and Affect  Mood: Why is someone telling me I'm going to jail?  Affect:Depressed; Constricted   Thought Process  Thought Processes:Disorganized  Descriptions of Associations:Loose  Orientation:Full (Time, Place and Person)  Thought Content:Delusions; Paranoid Ideation  History of Schizophrenia/Schizoaffective disorder:Yes  Duration of Psychotic Symptoms:Less than six months  Hallucinations:Hallucinations: Visual Description of Auditory Hallucinations: Someone keeps telling me I was born a bad person, why is someone telling  me I'm going to jail Description of Visual Hallucinations: seeing a lot of trees that don't move  Ideas of Reference:Paranoia; Percusatory; Delusions  Suicidal Thoughts:Suicidal Thoughts: No  Homicidal Thoughts:Homicidal Thoughts: No HI Passive Intent and/or Plan: Without Intent; Without Plan   Sensorium  Memory:Immediate Fair; Recent Fair; Remote Fair  Judgment:Poor  Insight:Lacking   Executive Functions  Concentration:Fair  Attention Span:Fair  Recall:Fair  Fund of Knowledge:Fair  Language:Fair   Psychomotor Activity  Psychomotor Activity:Psychomotor Activity: Normal   Assets  Assets:Communication Skills; Desire for Improvement; Housing; Social Support   Sleep  Sleep:Sleep: Fair Number of Hours of Sleep: 5.5    Physical Exam:  Physical Exam  Constitutional:      Appearance: the patient is not toxic-appearing.  Pulmonary:     Effort: Pulmonary effort is normal.  Neurological:     General: No focal deficit present.     Mental Status: the patient is alert and oriented to person, place, and time.   Review of Systems  Respiratory:  Negative for shortness of breath.   Cardiovascular:  Negative for chest pain.  Gastrointestinal:  Negative for abdominal pain, constipation, diarrhea, nausea and vomiting.  Neurological:  Negative for headaches.   Blood pressure 113/75, pulse 96, temperature 98.5 F (36.9 C), temperature source Oral, resp. rate 16, height 5\' 4"  (1.626 m), weight 50.8 kg, SpO2 100%. Body mass index is 19.22 kg/m.  Treatment Plan Summary: Daily contact with patient to assess and evaluate symptoms and progress in treatment, Medication management, and Plan    ASSESSMENT: Sarah Wade is a 22 y.o. female  with a past psychiatric history of schizoaffective disorder bipolar type, cannabis-induced psychosis. Patient initially arrived to [facility] on 9/2 for insomnia and trouble recognizing faces, and admitted to [facility] under IVC on 12/25/22 for acute  psychosis. PPHx is significant for no history of Suicide Attempts, Self Injurious Behavior, and 2 Prior Psychiatric Hospitalizations (11/2018, [facility] 01/2021). PMHx is significant for eczema.   Continues to be psychotic with hallucinations though more insight and willingness to talk compared to prior. Reports main symptom at this time is anxiety associated with psychosis and nightmares.   Diagnoses / Active Problems: Schizoaffective disorder bipolar type, current episode depressed (r/o MDD with psychotic features)   PLAN: Safety and Monitoring:            --  INVOLUNTARY  admission to inpatient psychiatric unit for safety, stabilization and treatment            -- Daily contact with patient to assess and evaluate symptoms and progress in treatment            -- Patient's case to be discussed in multi-disciplinary team meeting            -- Observation Level : q15 minute checks            -- Vital signs:  q12 hours            -- Precautions: suicide, elopement, and assault   2. Psychiatric Diagnoses and Treatment:             -Continue zyprexa 10mg  Q12H for psychosis   - Start prozac 10mg  for depression and anxiety -- The risks/benefits/side-effects/alternatives to this medication were discussed in detail with the patient and time was given for questions. The patient consents to medication trial.              -- Metabolic profile and EKG monitoring obtained while on an atypical antipsychotic  BMI: 19.22 TSH: pending Lipid Panel: pending HbgA1c: pending QTc: 456             -- Encouraged patient to participate in unit milieu and in scheduled group therapies             -- Short Term Goals: Ability to identify changes in lifestyle to reduce recurrence of condition will improve, Ability to verbalize feelings will improve, Ability to disclose and discuss suicidal ideas, Ability to demonstrate self-control will improve, Ability to identify and develop effective coping behaviors will  improve, Ability to maintain clinical measurements within normal limits will improve, Compliance with prescribed medications will improve, and Ability to identify triggers  associated with substance abuse/mental health issues will improve            -- Long Term Goals: Improvement in symptoms so as ready for discharge   Other PRNS:  PRN medications: tylenol, maalox, atarax, milk of mg, zofran, trazodone    3. Medical Issues Being Addressed:    #Eczema  Continue to monitor  #Constipation - Start miralax daily  4. Discharge Planning:   -- Social work and case management to assist with discharge planning and identification of hospital follow-up needs prior to discharge  -- Estimated LOS: 5-7 days  -- Discharge Concerns: Need to establish a safety plan; Medication compliance and effectiveness  -- Discharge Goals: Return home with outpatient referrals for mental health follow-up including medication management/psychotherapy   I certify that inpatient services furnished can reasonably be expected to improve the patient's condition.   This note was created using a voice recognition software as a result there may be grammatical errors inadvertently enclosed that do not reflect the nature of this encounter. Every attempt is made to correct such errors.   Dr. Karie Fetch, MD PGY-2, Psychiatry Residency  9/6/202412:04 PM

## 2022-12-28 NOTE — Group Note (Signed)
Date:  12/28/2022 Time:  9:23 PM  Group Topic/Focus:  Wrap-Up Group:   The focus of this group is to help patients review their daily goal of treatment and discuss progress on daily workbooks.    Participation Level:  Active  Participation Quality:  Appropriate  Affect:  Appropriate  Cognitive:  Appropriate  Insight: Appropriate  Engagement in Group:  Engaged  Modes of Intervention:  Education and Exploration  Additional Comments:  Patient attended and participated in group tonight. She reports that today her mother visited that was the most important thing for her.  Lita Mains [facility] 12/28/2022, 9:23 PM

## 2022-12-28 NOTE — Progress Notes (Signed)
   12/28/22 0603  15 Minute Checks  Location Dayroom  Visual Appearance Calm  Behavior Composed  Sleep (Behavioral Health Patients Only)  Calculate sleep? (Click Yes once per 24 hr at 0600 safety check) Yes  Documented sleep last 24 hours 5.5

## 2022-12-28 NOTE — Group Note (Signed)
Recreation Therapy Group Note   Group Topic:Team Building  Group Date: 12/28/2022 Start Time: 1035 End Time: 1105 Facilitators: Lillieann Pavlich-McCall, LRT,CTRS Location: [address] Dayroom   Goal Area(s) Addresses:  Patient will effectively work with peer towards shared goal.  Patient will identify skills used to make activity successful.  Patient will identify how skills used during activity can be used to reach post d/c goals.   Intervention: STEM Activity  Group Description:  Straw Bridge. In teams of 3-5, patients were given 15 plastic drinking straws and an equal length of masking tape. Using the materials provided, patients were instructed to build a free standing bridge-like structure to suspend an everyday item (ex: puzzle box) off of the floor or table surface. All materials were required to be used by the team in their design. LRT facilitated post-activity discussion reviewing team process. Patients were encouraged to reflect how the skills used in this activity can be generalized to daily life post discharge.    Affect/Mood: N/A   Participation Level: Did not attend    Clinical Observations/Individualized Feedback:    Plan: Continue to engage patient in RT group sessions 2-3x/week.   Sarah Wade, LRT,CTRS 12/28/2022 1:06 PM

## 2022-12-28 NOTE — BHH Group Notes (Signed)
Spiritual care group facilitated by Chaplain Dyanne Carrel, [facility]  Group focused on topic of strength. Group members reflected on what thoughts and feelings emerge when they hear this topic. They then engaged in facilitated dialog around how strength is present in their lives. This dialog focused on representing what strength had been to them in their lives (images and patterns given) and what they saw as helpful in their life now (what they needed / wanted).  Activity drew on narrative framework.  Patient Progress: Sarah Wade attended group and actively engaged and participated in group conversation and activities.  Comments were on topic and appropriate.  Sarah Wade did not make eye contact, but was very engaged.

## 2022-12-28 NOTE — BHH Counselor (Signed)
Adult Comprehensive Assessment  Patient ID: Sarah Wade, female   DOB: 2000/11/03, 22 y.o.   MRN: [id number]  Information Source: Information source: Patient  Current Stressors:  Patient states their primary concerns and needs for treatment are:: 22 y/o female present to [facility] involuntarily with acute psychosis, paranoia, audiovisual hallucinations and self-injurious behaviors. Pt reports having low self image and indicates that she feels unattractive. Pt appears to be fixated with horror movies and science fiction and acknowledges AVH. Pt currently denies SI/HI Patient states their goals for this hospitilization and ongoing recovery are:: Medication stabilization Educational / Learning stressors: pt declined Employment / Job issues: unemployed Family Relationships: none reported Surveyor, quantity / Lack of resources (include bankruptcy): pt denied Housing / Lack of housing: pt denied Physical health (include injuries & life threatening diseases): Acne Social relationships: "I am socially awkward" Substance abuse: none reported Bereavement / Loss: pt denied  Living/Environment/Situation:  Living Arrangements: Parent, Other relatives Who else lives in the home?: Mother, Grandparents and sibling How long has patient lived in current situation?: We moved here from [place] What is atmosphere in current home: Comfortable, Paramedic, Supportive  Family History:  Marital status: Single Are you sexually active?: Yes What is your sexual orientation?: Straight Has your sexual activity been affected by drugs, alcohol, medication, or emotional stress?: "I guess" Does patient have children?: No  Childhood History:  By whom was/is the patient raised?: Mother Description of patient's relationship with caregiver when they were a child: Life was great when we lived in [place] Patient's description of current relationship with people who raised him/her: "It's good, I just need to talk to her more" How were  you disciplined when you got in trouble as a child/adolescent?: timeout Does patient have siblings?: Yes Number of Siblings: 2 Description of patient's current relationship with siblings: We get along Did patient suffer any verbal/emotional/physical/sexual abuse as a child?: No Did patient suffer from severe childhood neglect?: No Has patient ever been sexually abused/assaulted/raped as an adolescent or adult?: No Was the patient ever a victim of a crime or a disaster?: No Witnessed domestic violence?: No Has patient been affected by domestic violence as an adult?: No  Education:  Highest grade of school patient has completed: Some college Currently a Consulting civil engineer?: No Learning disability?: No  Employment/Work Situation:   Employment Situation: Unemployed Patient's Job has Been Impacted by Current Illness: No What is the Longest Time Patient has Held a Job?: a couple of months 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?: No  Financial Resources:   Surveyor, quantity resources: Support from parents / caregiver, Media planner Does patient have a Lawyer or guardian?: No  Alcohol/Substance Abuse:   What has been your use of drugs/alcohol within the last 12 months?: pt denied If attempted suicide, did drugs/alcohol play a role in this?: No Alcohol/Substance Abuse Treatment Hx: Denies past history Has alcohol/substance abuse ever caused legal problems?: No  Social Support System:   Conservation officer, nature Support System: Fair Museum/gallery exhibitions officer System: "I have support, I just don't use it" Type of faith/religion: Ephriam Knuckles How does patient's faith help to cope with current illness?: I like to read  Leisure/Recreation:   Do You Have Hobbies?: Yes Leisure and Hobbies: Journaling  Strengths/Needs:   What is the patient's perception of their strengths?: I like to make others happy Patient states they can use these  personal strengths during their treatment to contribute to their recovery: "I would open  up more" Patient states these barriers may affect/interfere with their treatment: Social anxiety Other important information patient would like considered in planning for their treatment: Pt would prefer to be seen in person  Discharge Plan:   Currently receiving community mental health services: No Patient states they will know when they are safe and ready for discharge when: Once I start taking my medication again Does patient have access to transportation?: Yes Does patient have financial barriers related to discharge medications?: No Patient description of barriers related to discharge medications: none reported Will patient be returning to same living situation after discharge?: Yes  Summary/Recommendations:   Summary and Recommendations (to be completed by the evaluator): 22 y/o female pt presents to [facility] under IVC with worsening symptoms of psychosis, paranoia and AVH. Pt reports being bullied as a child and asserts that she has low self esteem and poor self image. Pt is unemployed and resides in the home a long with her Mother, grandparents and younger sibling. Pt expressed a desire to seek therapy and prefers to be seen in person. Pt has supportive family members and currently denies SI/HI. While here, Sarah Wade can benefit from crisis stabilization, medication management, therapeutic milieu, and referrals for services.  Kenneth Cuaresma S Katessa Attridge. 12/28/2022

## 2022-12-28 NOTE — Plan of Care (Signed)
Problem: Coping: Goal: Ability to verbalize frustrations and anger appropriately will improve Outcome: Progressing Goal: Ability to demonstrate self-control will improve Outcome: Progressing   Problem: Safety: Goal: Periods of time without injury will increase Outcome: Progressing   Pt A & O to self, place and situation when assessed. Denies SI, HI, AVH and pain. Affect flat, sullen with depressed mood. Eye contact is brief but with minimal and logical speech in comparison with previous day. Appears less isolative, less suspicious of others. Out of room to dayroom at brief intervals for scheduled groups. Pt's goal for today is "To prevail through tough times while keeping myself focused on God". Remains medication compliant without adverse drug reactions. Tolerated meals and fluids well. Support, encouragement and reassurance offered. Safety checks maintained at Q 15 minutes intervals without incident. Pt denies concerns at this time.

## 2022-12-28 NOTE — Progress Notes (Signed)
Adult Psychoeducational Group Note  Date:  12/28/2022 Time:  5:16 AM  Group Topic/Focus:  Wrap-Up Group:   The focus of this group is to help patients review their daily goal of treatment and discuss progress on daily workbooks.  Participation Level:  Active  Participation Quality:  Appropriate  Affect:  Appropriate  Cognitive:  Appropriate  Insight: Appropriate  Engagement in Group:  Engaged  Modes of Intervention:  Discussion  Additional Comments:  Pt stated her goal for the day was to read her books.  Pt met goal.  Wynema Birch D 12/28/2022, 5:16 AM

## 2022-12-28 NOTE — [facility] Suicide Risk Assessment (Signed)
[facility] INPATIENT:  Family/Significant Other Suicide Prevention Education  Suicide Prevention Education:  Education Completed; Sarah Wade (Mother)  has been identified by the patient as the family member/significant other with whom the patient will be residing, and identified as the person(s) who will aid the patient in the event of a mental health crisis (suicidal ideations/suicide attempt).  With written consent from the patient, the family member/significant other has been provided the following suicide prevention education, prior to the and/or following the discharge of the patient.  The suicide prevention education provided includes the following: Suicide risk factors Suicide prevention and interventions National Suicide Hotline telephone number [facility] assessment telephone number [facility] Emergency Assistance 911 [facility] and/or Residential Mobile Crisis Unit telephone number  Request made of family/significant other to: Remove weapons (e.g., guns, rifles, knives), all items previously/currently identified as safety concern.   Remove drugs/medications (over-the-counter, prescriptions, illicit drugs), all items previously/currently identified as a safety concern.  Sarah Wade verbalizes understanding of the suicide prevention education information provided.  The family member/significant other agrees to remove the items of safety concern listed above.  Sarah Wade 12/28/2022, 2:56 PM

## 2022-12-29 LAB — LIPID PANEL
Cholesterol: 160 mg/dL (ref 0–200)
HDL: 64 mg/dL (ref >40)
LDL Cholesterol: 90 mg/dL (ref 0–99)
Total CHOL/HDL Ratio: 2.5 ratio
Triglycerides: 29 mg/dL (ref <150)
VLDL: 6 mg/dL (ref 0–40)

## 2022-12-29 LAB — COMPREHENSIVE METABOLIC PANEL
ALT: 14 U/L (ref 0–44)
AST: 15 U/L (ref 15–41)
Albumin: 4.1 g/dL (ref 3.5–5.0)
Alkaline Phosphatase: 51 U/L (ref 38–126)
Anion gap: 7 (ref 5–15)
BUN: 13 mg/dL (ref 6–20)
CO2: 25 mmol/L (ref 22–32)
Calcium: 8.9 mg/dL (ref 8.9–10.3)
Chloride: 106 mmol/L (ref 98–111)
Creatinine, Ser: 0.84 mg/dL (ref 0.44–1.00)
GFR, Estimated: >60 mL/min (ref >60)
Glucose, Bld: 84 mg/dL (ref 70–99)
Potassium: 3.5 mmol/L (ref 3.5–5.1)
Sodium: 138 mmol/L (ref 135–145)
Total Bilirubin: 0.9 mg/dL (ref 0.3–1.2)
Total Protein: 6.6 g/dL (ref 6.5–8.1)

## 2022-12-29 LAB — TSH: TSH: 1.631 u[IU]/mL (ref 0.350–4.500)

## 2022-12-29 LAB — HEMOGLOBIN A1C
Hgb A1c MFr Bld: 5.2 % (ref 4.8–5.6)
Mean Plasma Glucose: 102.54 mg/dL

## 2022-12-29 MED ORDER — BISMUTH SUBSALICYLATE 262 MG PO CHEW: 524.0000 mg | CHEWABLE_TABLET | ORAL | Status: DC | PRN

## 2022-12-29 MED ORDER — POLYETHYLENE GLYCOL 3350 17 G PO PACK: 17.0000 g | PACK | Freq: Every day | ORAL | Status: DC | PRN

## 2022-12-29 MED ORDER — HYDROXYZINE HCL 25 MG PO TABS
25.0000 mg | ORAL_TABLET | Freq: Three times a day (TID) | ORAL | Status: DC | PRN
  Administered 2022-12-29 (×2): 25 mg via ORAL
  Filled 2022-12-29 (×2): qty 1

## 2022-12-29 MED ORDER — SENNA 8.6 MG PO TABS: 1.0000 | ORAL_TABLET | Freq: Every evening | ORAL | Status: DC | PRN

## 2022-12-29 MED ORDER — ALUM & MAG HYDROXIDE-SIMETH 200-200-20 MG/5ML PO SUSP: 30.0000 mL | ORAL | Status: DC | PRN

## 2022-12-29 MED ORDER — ONDANSETRON 4 MG PO TBDP: 8.0000 mg | ORAL_TABLET | Freq: Three times a day (TID) | ORAL | Status: DC | PRN

## 2022-12-29 MED ORDER — OLANZAPINE 15 MG PO TBDP
15.0000 mg | ORAL_TABLET | Freq: Two times a day (BID) | ORAL | Status: DC
  Administered 2022-12-29 – 2023-01-01 (×6): 15 mg via ORAL
  Filled 2022-12-29 (×9): qty 1

## 2022-12-29 NOTE — Progress Notes (Signed)
   12/29/22 4098  15 Minute Checks  Location Hallway  Visual Appearance Calm  Behavior Composed  Sleep (Behavioral Health Patients Only)  Calculate sleep? (Click Yes once per 24 hr at 0600 safety check) Yes  Documented sleep last 24 hours 6.25

## 2022-12-29 NOTE — Plan of Care (Signed)

## 2022-12-29 NOTE — Group Note (Signed)
Date:  12/29/2022 Time:  9:55 PM  Group Topic/Focus:  Wrap-Up Group:   The focus of this group is to help patients review their daily goal of treatment and discuss progress on daily workbooks.    Participation Level:  Did Not Attend   Scot Dock 12/29/2022, 9:55 PM

## 2022-12-29 NOTE — Progress Notes (Signed)
Pt visible at medication window on initial contact. Noted with fair eye contact, flat affect, depressed mood, logical and soft speech. Reports she's still paranoid "I don't feel safe in here, I want to go home". Still believes someone is following her in here. Rates her depression 7/10 and anxiety 0/10 "I just want to go home". Isolative to her room majority of this shift. Out of bed for medications. Tolerates meals and fluids well. Safety checks  maintained at Q 15 minutes intervals without incident. Continued support, encouragement and reassurance.

## 2022-12-29 NOTE — Plan of Care (Signed)
  Problem: Activity: Goal: Interest or engagement in activities will improve Outcome: Progressing Goal: Sleeping patterns will improve Outcome: Progressing   Problem: Coping: Goal: Ability to verbalize frustrations and anger appropriately will improve Outcome: Progressing   

## 2022-12-29 NOTE — Progress Notes (Signed)
[facility] MD Progress Note  12/29/2022 7:40 AM Sarah Wade  MRN:  [id number]  Principal Problem: Bipolar disorder, curr episode mixed, severe, w/o psychotic features (HCC) Diagnosis: Active Problems:   Schizoaffective disorder, bipolar type (HCC)  Reason for Admission:  Sarah Wade is a 22 y.o. female  with a past psychiatric history of schizoaffective disorder bipolar type, cannabis-induced psychosis. Patient initially arrived to [facility] on 9/2 for insomnia and trouble recognizing faces, and admitted to [facility] under IVC on 12/25/22 for acute psychosis. PPHx is significant for no history of Suicide Attempts, Self Injurious Behavior, and 2 Prior Psychiatric Hospitalizations (11/2018, [facility] 01/2021). PMHx is significant for eczema. (admitted on 12/25/2022, total  LOS: 4 days )  Chart Review from last 24 hours:  The patient's chart was reviewed and nursing notes were reviewed. The patient's case was discussed in multidisciplinary team meeting.   - Overnight events to report per chart review / staff report:  isolating from unit, still reportedly paranoid and anxious - Patient received all scheduled medications - Patient received the following PRN medications: trazodone  Information Obtained Today During Patient Interview: The patient was seen and evaluated on the unit. On assessment today the patient reports "I'm scared. I feel like I'm going to die." When asked why Sarah Wade feels this way, Sarah Wade says, "I don't know." Sarah Wade also says Sarah Wade feels paranoid. When asked if Sarah Wade feels someone is out to get her, Sarah Wade responds in the affirmative. When asked who Sarah Wade feels is out to get her, Sarah Wade responds, "I don't know."   Patient endorses good sleep; endorses good appetite.  Patient does not endorse any side-effects they attribute to medications.  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 Accepted to [facility] for inpatient psychiatric treatment.   Psychotherapy hx: [facility] history: None History of suicide (obtained from HPI): history of self-harm, scratch skin with knife 2 weeks ago. Denies suicide attempts. Reports found suicide note in her phone about putting gasoline on herself and lighting herself on fire. Document cutting leg.  History of homicide or aggression (obtained in HPI): none  Substance Abuse Hx: (Frequency, quantity, last use, impact) Alcohol: mom reports occasional  Tobacco: denies Cannabis: denies Other Illicit drugs: denies Rx drug abuse: denies Rehab hx: denies  Past Medical History: PCP: unknown Medical Dx: hemorrhagic cyst of R ovary, Eczema Medications: none Allergies: tylenol causes swelling, nickel causes rash Hospitalizations: none Surgeries: none Trauma: none Seizures: Denies   Contraceptives: Reports Sarah Wade goes to OB, unsure about contraception  Family Psychiatric History: none  Current Medications: Current Facility-Administered Medications  Medication Dose Route Frequency Provider Last Rate Last Admin   alum & mag hydroxide-simeth (MAALOX/MYLANTA) 200-200-20 MG/5ML suspension 30 mL  30 mL Oral Q4H PRN Onuoha, Josephine C, NP       diphenhydrAMINE (BENADRYL) capsule 50 mg  50 mg Oral TID PRN Dahlia Byes C, NP       Or   diphenhydrAMINE (BENADRYL) injection 50 mg  50 mg Intramuscular TID PRN Earney Navy, NP       feeding supplement  (ENSURE  ENLIVE / ENSURE PLUS) liquid 237 mL  237 mL Oral BID BM Massengill, Nathan, MD   237 mL at 12/28/22 1402   FLUoxetine (PROZAC) capsule 10 mg  10 mg Oral Daily Karie Fetch, MD   10 mg at 12/28/22 1121   haloperidol (HALDOL) tablet 5 mg  5 mg Oral TID PRN Dahlia Byes C, NP   5 mg at 12/26/22 1239   Or   haloperidol lactate (HALDOL) injection 5 mg  5 mg Intramuscular TID PRN Earney Navy, NP       hydrOXYzine (ATARAX) tablet 25 mg  25 mg Oral TID PRN Dahlia Byes C, NP   25 mg at 12/26/22 1139   ibuprofen (ADVIL) tablet 400 mg  400 mg Oral Q6H PRN Massengill, Harrold Donath, MD   400 mg at 12/27/22 7829   LORazepam (ATIVAN) tablet 2 mg  2 mg Oral TID PRN Dahlia Byes C, NP   2 mg at 12/26/22 1239   Or   LORazepam (ATIVAN) injection 2 mg  2 mg Intramuscular TID PRN Dahlia Byes C, NP       magnesium hydroxide (MILK OF MAGNESIA) suspension 30 mL  30 mL Oral Daily PRN Dahlia Byes C, NP       OLANZapine zydis (ZYPREXA) disintegrating tablet 10 mg  10 mg Oral Q12H Massengill, Harrold Donath, MD   10 mg at 12/28/22 2035   polyethylene glycol (MIRALAX / GLYCOLAX) packet 17 g  17 g Oral Daily Karie Fetch, MD   17 g at 12/28/22 1121   traZODone (DESYREL) tablet 50 mg  50 mg Oral QHS PRN Dahlia Byes C, NP   50 mg at 12/28/22 2035    Lab Results: No results found for this or any previous visit (from the past 48 hour(s)).  Blood Alcohol level:  Lab Results  Component Value Date   ETH <10 12/24/2022   ETH <10 02/06/2021    Metabolic Labs: Lab Results  Component Value Date   HGBA1C 5.1 02/06/2021   MPG 99.67 02/06/2021   No results found for: "PROLACTIN" Lab Results  Component Value Date   CHOL 199 02/06/2021   TRIG 30 02/06/2021   HDL 83 02/06/2021   CHOLHDL 2.4 02/06/2021   VLDL 6 02/06/2021   LDLCALC 110 (H) 02/06/2021    Physical Findings: AIMS: No  CIWA:    COWS:     Psychiatric Specialty Exam: General Appearance:  Casual   Eye  Contact:  Fair   Speech:  Normal Rate   Volume:  Decreased   Mood:  -- (In this world no matter what I do nobody will see me)   Affect:  Depressed; Constricted   Thought Content:  Delusions; Paranoid Ideation   Suicidal Thoughts:  Suicidal Thoughts: No   Homicidal Thoughts:  Homicidal Thoughts: No   Thought Process:  Disorganized   Orientation:  Full (Time, Place and Person)     Memory:  Immediate Fair; Recent Fair; Remote Fair   Judgment:  Poor   Insight:  Lacking   Concentration:  Fair   Recall:  [employer] of Knowledge:  Fair   Language:  Fair   Psychomotor Activity:  Psychomotor Activity: Normal   Assets:  Manufacturing systems engineer; Desire for Improvement; Housing; Social Support   Sleep:  Sleep: Fair Number of Hours of Sleep: 5.5    Review of Systems Review of Systems  Constitutional: Negative.   Respiratory: Negative.    Cardiovascular: Negative.   Gastrointestinal: Negative.   Genitourinary: Negative.  Psychiatric/Behavioral:         Psychiatric subjective data addressed in PSE or HPI / daily subjective report    Vital Signs: Blood pressure 109/74, pulse 69, temperature 98 F (36.7 C), temperature source Oral, resp. rate 16, height 5\' 4"  (1.626 m), weight 50.8 kg, SpO2 100%. Body mass index is 19.22 kg/m. Physical Exam Vitals and nursing note reviewed.  HENT:     Head: Normocephalic and atraumatic.  Pulmonary:     Effort: Pulmonary effort is normal.  Musculoskeletal:     Cervical back: Normal range of motion.  Neurological:     General: No focal deficit present.     Mental Status: Sarah Wade is alert. Mental status is at baseline.     Assets  Assets: Manufacturing systems engineer; Desire for Improvement; Housing; Social Support   Treatment Plan Summary: Daily contact with patient to assess and evaluate symptoms and progress in treatment and Medication management  Diagnoses / Active Problems: Bipolar disorder, curr episode mixed,  severe, w/o psychotic features (HCC) Active Problems:   Schizoaffective disorder, bipolar type (HCC)   ASSESSMENT: Schizoaffective disorder bipolar type, current episode depressed Cannabis-induced psychosis  these diagnoses are provisional diagnoses and subject to change as the patient's clinical picture evolves or new information is revealed, including substances (drugs of abuse, medications), another medical condition, or better explained by another psychiatric diagnosis.  PLAN: Safety and Monitoring:  -- Involuntary admission to inpatient psychiatric unit for safety, stabilization and treatment  -- Daily contact with patient to assess and evaluate symptoms and progress in treatment  -- Patient's case to be discussed in multi-disciplinary team meeting  -- Observation Level : q15 minute checks  -- Vital signs:  q12 hours  -- Precautions: suicide, elopement, and assault  2. Interventions (medications, psychoeducation, etc):   -- increase olanzapine disintegrating tablet from 10 mg to 15 mg every 12 hours for psychosis  -- continue fluoxetine 10 mg for depressive symptoms and anxiety  -- Patient does not need nicotine replacement  PRN medications for symptomatic management:              -- continue ibuprofen 400 mg every 6 hours as needed for fever, headache, pain, and cramping              -- continue hydroxyzine 25 mg three times a day as needed for anxiety              -- continue bismuth subsalicylate 524 mg oral chewable tablet every 3 hours as needed for diarrhea / loose stools              -- continue senna 8.6 mg oral at bedtime and polyethylene glycol 17 g oral daily as needed for mild to moderate constipation              -- continue ondansetron 8 mg every 8 hours as needed for nausea or vomiting              -- continue aluminum-magnesium hydroxide + simethicone 30 mL every 4 hours as needed for heartburn or indigestion  -- As needed agitation protocol in-place  The  risks/benefits/side-effects/alternatives to the above medication were discussed in detail with the patient and time was given for questions. The patient consents to medication trial. FDA black box warnings, if present, were discussed.  The patient is agreeable with the medication plan, as above. We will monitor the patient's response to pharmacologic treatment, and adjust medications as necessary.  3. Routine and other pertinent  labs:             -- Metabolic profile:  BMI: Body mass index is 19.22 kg/m.  Prolactin: No results found for: "PROLACTIN"  Lipid Panel: Lab Results  Component Value Date   CHOL 199 02/06/2021   TRIG 30 02/06/2021   HDL 83 02/06/2021   CHOLHDL 2.4 02/06/2021   VLDL 6 02/06/2021   LDLCALC 110 (H) 02/06/2021    HbgA1c: Hgb A1c MFr Bld (%)  Date Value  02/06/2021 5.1    TSH: TSH (uIU/mL)  Date Value  02/06/2021 1.700    EKG monitoring: QTc: 456 (12/25/2022)  4. Group Therapy:  -- Encouraged patient to participate in unit milieu and in scheduled group therapies   -- Short Term Goals: Ability to identify changes in lifestyle to reduce recurrence of condition, verbalize feelings, identify and develop effective coping behaviors, maintain clinical measurements within normal limits, and identify triggers associated with substance abuse/mental health issues will improve. Improvement in ability to demonstrate self-control and comply with prescribed medications.  -- Long Term Goals: Improvement in symptoms so as ready for discharge -- Patient is encouraged to participate in group therapy while admitted to the psychiatric unit. -- We will address other chronic and acute stressors, which contributed to the patient's Bipolar disorder, curr episode mixed, severe, w/o psychotic features (HCC) in order to reduce the risk of self-harm at discharge.  5. Discharge Planning:   -- Social work and case management to assist with discharge planning and identification of  hospital follow-up needs prior to discharge  -- Estimated LOS: 5 - 6 days  -- Discharge Concerns: Need to establish a safety plan; Medication compliance and effectiveness  -- Discharge Goals: Return home with outpatient referrals for mental health follow-up including medication management/psychotherapy  I certify that inpatient services furnished can reasonably be expected to improve the patient's condition.   Signed: Augusto Gamble, MD 12/29/2022, 7:40 AM

## 2022-12-29 NOTE — Progress Notes (Signed)
   12/28/22 2035  Psych Admission Type (Psych Patients Only)  Admission Status Involuntary  Psychosocial Assessment  Patient Complaints Anxiety  Eye Contact Brief  Facial Expression Flat  Affect Appropriate to circumstance  Speech Logical/coherent;Soft  Interaction Cautious;Guarded  Motor Activity Slow  Appearance/Hygiene Unremarkable  Behavior Characteristics Cooperative  Mood Preoccupied;Sad  Thought Ship broker  Content Preoccupation;Paranoia  Delusions Paranoid  Perception WDL  Hallucination None reported or observed  Judgment Impaired  Confusion None  Danger to Self  Current suicidal ideation? Denies  Danger to Others  Danger to Others None reported or observed    Pt was offered support and encouragement. Pt was given scheduled medications. Given PRN Trazodone per MAR. Q 15 minute checks were done for safety. Pt attended group and interacts well with peers and staff. Pt is taking medication. Pt has no complaints.Pt receptive to treatment and safety maintained on unit.

## 2022-12-30 NOTE — [facility] Group Notes (Signed)
Pt was encouraged but refused to attend wrap up group

## 2022-12-30 NOTE — Progress Notes (Signed)
   12/30/22 1500  Psych Admission Type (Psych Patients Only)  Admission Status Involuntary  Psychosocial Assessment  Patient Complaints Suspiciousness  Eye Contact Brief  Facial Expression Sad;Worried  Affect Depressed  Speech Soft  Interaction Isolative  Motor Activity Slow  Appearance/Hygiene Unremarkable  Behavior Characteristics Cooperative;Anxious  Mood Preoccupied  Thought Process  Coherency Circumstantial  Content Preoccupation;Paranoia  Delusions Paranoid  Perception WDL  Hallucination None reported or observed  Judgment Impaired  Confusion None  Danger to Self  Current suicidal ideation? Denies  Danger to Others  Danger to Others None reported or observed

## 2022-12-30 NOTE — Progress Notes (Signed)
   12/30/22 8119  15 Minute Checks  Location Bedroom  Visual Appearance Calm  Behavior Composed  Sleep (Behavioral Health Patients Only)  Calculate sleep? (Click Yes once per 24 hr at 0600 safety check) Yes  Documented sleep last 24 hours 7

## 2022-12-30 NOTE — Progress Notes (Signed)
[facility] MD Progress Note  12/30/2022 9:05 AM Sarah Wade  MRN:  [id number]  Principal Problem: Bipolar disorder, curr episode mixed, severe, w/o psychotic features (HCC) Diagnosis: Active Problems:   Schizoaffective disorder, bipolar type (HCC)  Reason for Admission:  Sarah Wade is a 22 y.o. female  with a past psychiatric history of schizoaffective disorder bipolar type, cannabis-induced psychosis. Patient initially arrived to [facility] on 9/2 for insomnia and trouble recognizing faces, and admitted to [facility] under IVC on 12/25/22 for acute psychosis. PPHx is significant for no history of Suicide Attempts, Self Injurious Behavior, and 2 Prior Psychiatric Hospitalizations (11/2018, [facility] 01/2021). PMHx is significant for eczema. (admitted on 12/25/2022, total  LOS: 5 days )  Chart Review from last 24 hours:  The patient's chart was reviewed and nursing notes were reviewed. The patient's case was discussed in multidisciplinary team meeting.   - Overnight events to report per chart review / staff report:  still reportedly paranoid especially about frequent room checks - Patient received all scheduled medications - Patient received the following PRN medications: trazodone  and hydroxyzine  Information Obtained Today During Patient Interview: The patient was seen and evaluated on the unit. On assessment today the patient reports she is no longer feeling anxious or feeling like someone is out to get her. When asked what changed from yesterday, she says, "I am a loner and yesterday I was just having childish thoughts." When I asked patient to clarify, she states, "I just wasn't used to being here. I'm used to being at home."  She asks if she can leave today because she has to go home to "do a bunch of stuff" and find out when her orthodontic appointment is and has upcoming clinic appointment obligations. I responded we would be keeping her for some more time to continue observing her behaviors. 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  Patient endorses good sleep; endorses good appetite.  Patient does not endorse any side-effects they attribute to medications.  Collateral information obtained Earnest Rosier, patient's mother) Patient granted permission to speak to contact person without restrictions.  Tiffany says patient is doing well. She says patient is doing much better and is back to her normal self. She says patient lives with her and moving forward she plans to be more involved in patient's psychiatric care. She says there are no weapons at home nor stockpiles of pills.  During this conversation, I explained in simple terms the patient's mental health condition, answered questions pertaining to the patient's current treatment and provided updates, outlined the treatment plan moving forward, and provided guidance on safety planning (ie securing firearms, safe medication allocation, etc).  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 Accepted to [facility] for inpatient psychiatric treatment.   Psychotherapy hx: [facility] history: None History of suicide (obtained from HPI): history of self-harm, scratch skin with knife 2 weeks ago. Denies suicide attempts. Reports found suicide note in her phone about putting gasoline on herself and lighting herself on fire. Document  cutting leg.  History of homicide or aggression (obtained  in HPI): none  Substance Abuse Hx: (Frequency, quantity, last use, impact) Alcohol: mom reports occasional  Tobacco: denies Cannabis: denies Other Illicit drugs: denies Rx drug abuse: denies Rehab hx: denies  Past Medical History: PCP: unknown Medical Dx: hemorrhagic cyst of R ovary, Eczema Medications: none Allergies: tylenol causes swelling, nickel causes rash Hospitalizations: none Surgeries: none Trauma: none Seizures: Denies   Contraceptives: Reports she goes to OB, unsure about contraception  Family Psychiatric History: none  Current Medications: Current Facility-Administered Medications  Medication Dose Route Frequency Provider Last Rate Last Admin   alum & mag hydroxide-simeth (MAALOX/MYLANTA) 200-200-20 MG/5ML suspension 30 mL  30 mL Oral Q4H PRN Augusto Gamble, MD       bismuth subsalicylate (PEPTO BISMOL) chewable tablet 524 mg  524 mg Oral Q3H PRN Augusto Gamble, MD       diphenhydrAMINE (BENADRYL) capsule 50 mg  50 mg Oral TID PRN Dahlia Byes C, NP       Or   diphenhydrAMINE (BENADRYL) injection 50 mg  50 mg Intramuscular TID PRN Dahlia Byes C, NP       feeding supplement (ENSURE ENLIVE / ENSURE PLUS) liquid 237 mL  237 mL Oral BID BM Massengill, Harrold Donath, MD   237 mL at 12/29/22 1804   FLUoxetine (PROZAC) capsule 10 mg  10 mg Oral Daily Karie Fetch, MD   10 mg at 12/30/22 1610   haloperidol (HALDOL) tablet 5 mg  5 mg Oral TID PRN Dahlia Byes C, NP   5 mg at 12/26/22 1239   Or   haloperidol lactate (HALDOL) injection 5 mg  5 mg Intramuscular TID PRN Dahlia Byes C, NP       hydrOXYzine (ATARAX) tablet 25 mg  25 mg Oral TID PRN Augusto Gamble, MD   25 mg at 12/29/22 2228   ibuprofen (ADVIL) tablet 400 mg  400 mg Oral Q6H PRN Massengill, Harrold Donath, MD   400 mg at 12/27/22 9604   LORazepam (ATIVAN) tablet 2 mg  2 mg Oral TID PRN Dahlia Byes C, NP   2 mg at 12/26/22 1239   Or   LORazepam  (ATIVAN) injection 2 mg  2 mg Intramuscular TID PRN Dahlia Byes C, NP       OLANZapine zydis (ZYPREXA) disintegrating tablet 15 mg  15 mg Oral Q12H Augusto Gamble, MD   15 mg at 12/30/22 0804   ondansetron (ZOFRAN-ODT) disintegrating tablet 8 mg  8 mg Oral Q8H PRN Augusto Gamble, MD       polyethylene glycol (MIRALAX / GLYCOLAX) packet 17 g  17 g Oral Daily Karie Fetch, MD   17 g at 12/29/22 0819   polyethylene glycol (MIRALAX / GLYCOLAX) packet 17 g  17 g Oral Daily PRN Augusto Gamble, MD       senna (SENOKOT) tablet 8.6 mg  1 tablet Oral QHS PRN Augusto Gamble, MD       traZODone (DESYREL) tablet 50 mg  50 mg Oral QHS PRN Dahlia Byes C, NP   50 mg at 12/29/22 2108    Lab Results:  Results for orders placed or performed during the hospital encounter of 12/25/22 (from the past 48 hour(s))  Comprehensive metabolic panel     Status: None   Collection Time: 12/29/22  6:27 AM  Result Value Ref Range   Sodium 138 135 - 145 mmol/L   Potassium 3.5 3.5 - 5.1 mmol/L   Chloride 106 98 - 111 mmol/L   CO2 25 22 - 32 mmol/L   Glucose,  Bld 84 70 - 99 mg/dL    Comment: Glucose reference range applies only to samples taken after fasting for at least 8 hours.   BUN 13 6 - 20 mg/dL   Creatinine, Ser 7.82 0.44 - 1.00 mg/dL   Calcium 8.9 8.9 - 95.6 mg/dL   Total Protein 6.6 6.5 - 8.1 g/dL   Albumin 4.1 3.5 - 5.0 g/dL   AST 15 15 - 41 U/L   ALT 14 0 - 44 U/L   Alkaline Phosphatase 51 38 - 126 U/L   Total Bilirubin 0.9 0.3 - 1.2 mg/dL   GFR, Estimated >21 >30 mL/min    Comment: (NOTE) Calculated using the CKD-EPI Creatinine Equation (2021)    Anion gap 7 5 - 15    Comment: Performed at [facility], [address] [place], [place] [address]  Hemoglobin A1c     Status: None   Collection Time: 12/29/22  6:27 AM  Result Value Ref Range   Hgb A1c MFr Bld 5.2 4.8 - 5.6 %    Comment: (NOTE) Pre diabetes:          5.7%-6.4%  Diabetes:              >6.4%  Glycemic control for    <7.0% adults with diabetes    Mean Plasma Glucose 102.54 mg/dL    Comment: Performed at [facility], [address] [place], [place] [address]  Lipid panel     Status: None   Collection Time: 12/29/22  6:27 AM  Result Value Ref Range   Cholesterol 160 0 - 200 mg/dL   Triglycerides 29 <952 mg/dL   HDL 64 >84 mg/dL   Total CHOL/HDL Ratio 2.5 RATIO   VLDL 6 0 - 40 mg/dL   LDL Cholesterol 90 0 - 99 mg/dL    Comment:        Total Cholesterol/HDL:CHD Risk Coronary Heart Disease Risk Table                     Men   Women  1/2 Average Risk   3.4   3.3  Average Risk       5.0   4.4  2 X Average Risk   9.6   7.1  3 X Average Risk  23.4   11.0        Use the calculated Patient Ratio above and the CHD Risk Table to determine the patient's CHD Risk.        ATP III CLASSIFICATION (LDL):  <100     mg/dL   Optimal  132-440  mg/dL   Near or Above                    Optimal  130-159  mg/dL   Borderline  102-725  mg/dL   High  >366     mg/dL   Very High Performed at [facility], [address], [place], [place] [address]   TSH     Status: None   Collection Time: 12/29/22  6:27 AM  Result Value Ref Range   TSH 1.631 0.350 - 4.500 uIU/mL    Comment: Performed by a 3rd Generation assay with a functional sensitivity of <=0.01 uIU/mL. Performed at [facility], [address], [place], [place] [address]     Blood Alcohol level:  Lab Results  Component Value Date   ETH <10 12/24/2022   ETH <10 02/06/2021  Metabolic Labs: Lab Results  Component Value Date   HGBA1C 5.2 12/29/2022   MPG 102.54 12/29/2022   MPG 99.67 02/06/2021   No results found for: "PROLACTIN" Lab Results  Component Value Date   CHOL 160 12/29/2022   TRIG 29 12/29/2022   HDL 64 12/29/2022   CHOLHDL 2.5 12/29/2022   VLDL 6 12/29/2022   LDLCALC 90 12/29/2022   LDLCALC 110 (H) 02/06/2021    Physical Findings: AIMS: No  CIWA:    COWS:     Psychiatric  Specialty Exam: General Appearance:  Casual   Eye Contact:  Good   Speech:  Clear and Coherent; Normal Rate   Volume:  Normal   Mood:  -- ("I feel better")   Affect:  Appropriate; Congruent; Full Range   Thought Content:  WDL   Suicidal Thoughts:  Suicidal Thoughts: No SI Passive Intent and/or Plan: Without Intent   Homicidal Thoughts:  Homicidal Thoughts: No HI Passive Intent and/or Plan: Without Intent   Thought Process:  Coherent; Goal Directed; Linear   Orientation:  Full (Time, Place and Person)     Memory:  Immediate Good; Recent Good; Remote Good   Judgment:  Good   Insight:  Good   Concentration:  Good   Recall:  Good   Fund of Knowledge:  Good   Language:  Good   Psychomotor Activity:  Psychomotor Activity: Normal   Assets:  Resilience   Sleep:  Sleep: Good     Review of Systems Review of Systems  Constitutional: Negative.   Respiratory: Negative.    Cardiovascular: Negative.   Gastrointestinal: Negative.   Genitourinary: Negative.   Psychiatric/Behavioral:         Psychiatric subjective data addressed in PSE or HPI / daily subjective report    Vital Signs: Blood pressure 117/75, pulse 79, temperature 98.5 F (36.9 C), temperature source Oral, resp. rate 16, height 5\' 4"  (1.626 m), weight 50.8 kg, SpO2 100%. Body mass index is 19.22 kg/m. Physical Exam Vitals and nursing note reviewed.  HENT:     Head: Normocephalic and atraumatic.  Pulmonary:     Effort: Pulmonary effort is normal.  Musculoskeletal:     Cervical back: Normal range of motion.  Neurological:     General: No focal deficit present.     Mental Status: She is alert. Mental status is at baseline.     Assets  Assets: Resilience   Treatment Plan Summary: Daily contact with patient to assess and evaluate symptoms and progress in treatment and Medication management  Diagnoses / Active Problems: Bipolar disorder, curr episode mixed, severe, w/o  psychotic features (HCC) Active Problems:   Schizoaffective disorder, bipolar type (HCC)   ASSESSMENT: Schizoaffective disorder bipolar type, current episode depressed Cannabis-induced psychosis  these diagnoses are provisional diagnoses and subject to change as the patient's clinical picture evolves or new information is revealed, including substances (drugs of abuse, medications), another medical condition, or better explained by another psychiatric diagnosis.  PLAN: Safety and Monitoring:  -- Involuntary admission to inpatient psychiatric unit for safety, stabilization and treatment  -- Daily contact with patient to assess and evaluate symptoms and progress in treatment  -- Patient's case to be discussed in multi-disciplinary team meeting  -- Observation Level : q15 minute checks  -- Vital signs:  q12 hours  -- Precautions: suicide, elopement, and assault  2. Interventions (medications, psychoeducation, etc):   -- continue olanzapine disintegrating tablet 15 mg every 12 hours for psychosis  -- continue fluoxetine 10 mg  for depressive symptoms and anxiety  -- Patient does not need nicotine replacement  PRN medications for symptomatic management:              -- continue ibuprofen 400 mg every 6 hours as needed for fever, headache, pain, and cramping              -- continue hydroxyzine 25 mg three times a day as needed for anxiety              -- continue bismuth subsalicylate 524 mg oral chewable tablet every 3 hours as needed for diarrhea / loose stools              -- continue senna 8.6 mg oral at bedtime and polyethylene glycol 17 g oral daily as needed for mild to moderate constipation              -- continue ondansetron 8 mg every 8 hours as needed for nausea or vomiting              -- continue aluminum-magnesium hydroxide + simethicone 30 mL every 4 hours as needed for heartburn or indigestion  -- As needed agitation protocol in-place  The  risks/benefits/side-effects/alternatives to the above medication were discussed in detail with the patient and time was given for questions. The patient consents to medication trial. FDA black box warnings, if present, were discussed.  The patient is agreeable with the medication plan, as above. We will monitor the patient's response to pharmacologic treatment, and adjust medications as necessary.  3. Routine and other pertinent labs:             -- Metabolic profile:  BMI: Body mass index is 19.22 kg/m.  Prolactin: No results found for: "PROLACTIN"  Lipid Panel: Lab Results  Component Value Date   CHOL 160 12/29/2022   TRIG 29 12/29/2022   HDL 64 12/29/2022   CHOLHDL 2.5 12/29/2022   VLDL 6 12/29/2022   LDLCALC 90 12/29/2022   LDLCALC 110 (H) 02/06/2021    HbgA1c: Hgb A1c MFr Bld (%)  Date Value  12/29/2022 5.2    TSH: TSH (uIU/mL)  Date Value  12/29/2022 1.631    EKG monitoring: QTc: 456 (12/25/2022)  4. Group Therapy:  -- Encouraged patient to participate in unit milieu and in scheduled group therapies   -- Short Term Goals: Ability to identify changes in lifestyle to reduce recurrence of condition, verbalize feelings, identify and develop effective coping behaviors, maintain clinical measurements within normal limits, and identify triggers associated with substance abuse/mental health issues will improve. Improvement in ability to demonstrate self-control and comply with prescribed medications.  -- Long Term Goals: Improvement in symptoms so as ready for discharge -- Patient is encouraged to participate in group therapy while admitted to the psychiatric unit. -- We will address other chronic and acute stressors, which contributed to the patient's Bipolar disorder, curr episode mixed, severe, w/o psychotic features (HCC) in order to reduce the risk of self-harm at discharge.  5. Discharge Planning:   -- Social work and case management to assist with discharge planning  and identification of hospital follow-up needs prior to discharge  -- Estimated LOS: 5 days  -- Discharge Concerns: Need to establish a safety plan; Medication compliance and effectiveness  -- Discharge Goals: Return home with outpatient referrals for mental health follow-up including medication management/psychotherapy  I certify that inpatient services furnished can reasonably be expected to improve the patient's condition.   Signed: Augusto Gamble, MD  12/30/2022, 9:05 AM

## 2022-12-30 NOTE — Progress Notes (Signed)
   12/29/22 2108  Psych Admission Type (Psych Patients Only)  Admission Status Involuntary  Psychosocial Assessment  Patient Complaints Anxiety;Sadness;Suspiciousness;Worrying  Eye Contact Brief  Facial Expression Flat;Sad;Worried  Affect Depressed;Apprehensive  Speech Soft  Interaction Hypervigilant;Isolative  Motor Activity Slow  Appearance/Hygiene Unremarkable  Behavior Characteristics Anxious  Mood Depressed;Preoccupied;Sad  Thought Process  Coherency Circumstantial  Content Preoccupation;Paranoia  Delusions Paranoid  Perception WDL  Hallucination None reported or observed  Judgment Impaired  Confusion None  Danger to Self  Current suicidal ideation? Denies  Danger to Others  Danger to Others None reported or observed   Pt paranoid about safety in the hospital. Pt stated "I need to leave, I can't stay in the hospital right now. I need to call the police. I'm scared something bad will happen to me. I don't feel safe." Reassured pt that she is safe on the unit. Pt isolative to room and minimally interactive with staff and peers. Pt was given scheduled medications. Given PRN Trazodone per MAR. Q 15 minute checks were done for safety. Safety maintained on the unit.

## 2022-12-30 NOTE — Plan of Care (Signed)
  Problem: Activity: Goal: Interest or engagement in activities will improve Outcome: Not Progressing   Problem: Coping: Goal: Ability to demonstrate self-control will improve Outcome: Progressing

## 2022-12-30 NOTE — Plan of Care (Signed)
  Problem: Safety: Goal: Periods of time without injury will increase Outcome: Progressing   Problem: Education: Goal: Emotional status will improve Outcome: Not Progressing Goal: Mental status will improve Outcome: Not Progressing

## 2022-12-30 NOTE — Progress Notes (Signed)
Pt increasingly anxious and paranoid. Pt stated "Why does someone keep going into my room? I'm scared that someone is going to come in and touch me while I sleep. Random people and patients can't go in my room right? Why can't I just lock my room and go to sleep? I'm scared." Reassured pt that she is safe on the unit. Explained to patient that staff will continoulsy be checking on her every 15 minutes to ensure her safety. Provided emotional support to patient. Offered PRN Hydroxyzine. Pt was agreeable and was given PRN Hydroxyzine.

## 2022-12-31 ENCOUNTER — Encounter (HOSPITAL_COMMUNITY): Payer: Self-pay

## 2022-12-31 DIAGNOSIS — F250 Schizoaffective disorder, bipolar type: Secondary | ICD-10-CM | POA: Diagnosis not present

## 2022-12-31 MED ORDER — FLUOXETINE HCL 20 MG PO CAPS
20.0000 mg | ORAL_CAPSULE | Freq: Every day | ORAL | Status: DC
  Administered 2023-01-01: 20 mg via ORAL
  Filled 2022-12-31 (×3): qty 1

## 2022-12-31 MED ORDER — SENNA 8.6 MG PO TABS
1.0000 | ORAL_TABLET | Freq: Once | ORAL | Status: AC
  Administered 2022-12-31: 8.6 mg via ORAL
  Filled 2022-12-31: qty 1

## 2022-12-31 NOTE — BH IP Treatment Plan (Signed)
Interdisciplinary Treatment and Diagnostic Plan Update  12/31/2022 Time of Session: 10:05 AM ( UPDATE)  Sarah Wade MRN: [id number]  Principal Diagnosis: Bipolar disorder, curr episode mixed, severe, w/o psychotic features (HCC)  Secondary Diagnoses: Active Problems:   Schizoaffective disorder, bipolar type (HCC)   Current Medications:  Current Facility-Administered Medications  Medication Dose Route Frequency Provider Last Rate Last Admin   alum & mag hydroxide-simeth (MAALOX/MYLANTA) 200-200-20 MG/5ML suspension 30 mL  30 mL Oral Q4H PRN Augusto Gamble, MD       bismuth subsalicylate (PEPTO BISMOL) chewable tablet 524 mg  524 mg Oral Q3H PRN Augusto Gamble, MD       diphenhydrAMINE (BENADRYL) capsule 50 mg  50 mg Oral TID PRN Dahlia Byes C, NP       Or   diphenhydrAMINE (BENADRYL) injection 50 mg  50 mg Intramuscular TID PRN Dahlia Byes C, NP       feeding supplement (ENSURE ENLIVE / ENSURE PLUS) liquid 237 mL  237 mL Oral BID BM Massengill, Nathan, MD   237 mL at 12/31/22 1540   [START ON 01/01/2023] FLUoxetine (PROZAC) capsule 20 mg  20 mg Oral Daily Karie Fetch, MD       haloperidol (HALDOL) tablet 5 mg  5 mg Oral TID PRN Dahlia Byes C, NP   5 mg at 12/26/22 1239   Or   haloperidol lactate (HALDOL) injection 5 mg  5 mg Intramuscular TID PRN Dahlia Byes C, NP       hydrOXYzine (ATARAX) tablet 25 mg  25 mg Oral TID PRN Augusto Gamble, MD   25 mg at 12/29/22 2228   ibuprofen (ADVIL) tablet 400 mg  400 mg Oral Q6H PRN Massengill, Harrold Donath, MD   400 mg at 12/27/22 8413   LORazepam (ATIVAN) tablet 2 mg  2 mg Oral TID PRN Dahlia Byes C, NP   2 mg at 12/26/22 1239   Or   LORazepam (ATIVAN) injection 2 mg  2 mg Intramuscular TID PRN Dahlia Byes C, NP       OLANZapine zydis (ZYPREXA) disintegrating tablet 15 mg  15 mg Oral Q12H Augusto Gamble, MD   15 mg at 12/31/22 0813   ondansetron (ZOFRAN-ODT) disintegrating tablet 8 mg  8 mg Oral Q8H PRN Augusto Gamble, MD        polyethylene glycol (MIRALAX / GLYCOLAX) packet 17 g  17 g Oral Daily Karie Fetch, MD   17 g at 12/31/22 0813   polyethylene glycol (MIRALAX / GLYCOLAX) packet 17 g  17 g Oral Daily PRN Augusto Gamble, MD       senna (SENOKOT) tablet 8.6 mg  1 tablet Oral QHS PRN Augusto Gamble, MD       traZODone (DESYREL) tablet 50 mg  50 mg Oral QHS PRN Dahlia Byes C, NP   50 mg at 12/29/22 2108   PTA Medications: No medications prior to admission.    Patient Stressors: Financial difficulties   Health problems   Marital or family conflict   Medication change or noncompliance    Patient Strengths: Manufacturing systems engineer  Supportive family/friends   Treatment Modalities: Medication Management, Group therapy, Case management,  1 to 1 session with clinician, Psychoeducation, Recreational therapy.   Physician Treatment Plan for Primary Diagnosis: Bipolar disorder, curr episode mixed, severe, w/o psychotic features (HCC) Long Term Goal(s): Improvement in symptoms so as ready for discharge   Short Term Goals: Ability to identify changes in lifestyle to reduce recurrence of condition will improve Ability to verbalize feelings  will improve Ability to disclose and discuss suicidal ideas Ability to demonstrate self-control will improve Ability to identify and develop effective coping behaviors will improve Ability to maintain clinical measurements within normal limits will improve Compliance with prescribed medications will improve Ability to identify triggers associated with substance abuse/mental health issues will improve  Medication Management: Evaluate patient's response, side effects, and tolerance of medication regimen.  Therapeutic Interventions: 1 to 1 sessions, Unit Group sessions and Medication administration.  Evaluation of Outcomes: Progressing  Physician Treatment Plan for Secondary Diagnosis: Active Problems:   Schizoaffective disorder, bipolar type (HCC)  Long Term Goal(s):  Improvement in symptoms so as ready for discharge   Short Term Goals: Ability to identify changes in lifestyle to reduce recurrence of condition will improve Ability to verbalize feelings will improve Ability to disclose and discuss suicidal ideas Ability to demonstrate self-control will improve Ability to identify and develop effective coping behaviors will improve Ability to maintain clinical measurements within normal limits will improve Compliance with prescribed medications will improve Ability to identify triggers associated with substance abuse/mental health issues will improve     Medication Management: Evaluate patient's response, side effects, and tolerance of medication regimen.  Therapeutic Interventions: 1 to 1 sessions, Unit Group sessions and Medication administration.  Evaluation of Outcomes: Progressing   RN Treatment Plan for Primary Diagnosis: Bipolar disorder, curr episode mixed, severe, w/o psychotic features (HCC) Long Term Goal(s): Knowledge of disease and therapeutic regimen to maintain health will improve  Short Term Goals: Ability to remain free from injury will improve, Ability to verbalize frustration and anger appropriately will improve, Ability to participate in decision making will improve, Ability to verbalize feelings will improve, Ability to identify and develop effective coping behaviors will improve, and Compliance with prescribed medications will improve  Medication Management: RN will administer medications as ordered by provider, will assess and evaluate patient's response and provide education to patient for prescribed medication. RN will report any adverse and/or side effects to prescribing provider.  Therapeutic Interventions: 1 on 1 counseling sessions, Psychoeducation, Medication administration, Evaluate responses to treatment, Monitor vital signs and CBGs as ordered, Perform/monitor CIWA, COWS, AIMS and Fall Risk screenings as ordered, Perform  wound care treatments as ordered.  Evaluation of Outcomes: Progressing   LCSW Treatment Plan for Primary Diagnosis: Bipolar disorder, curr episode mixed, severe, w/o psychotic features (HCC) Long Term Goal(s): Safe transition to appropriate next level of care at discharge, Engage patient in therapeutic group addressing interpersonal concerns.  Short Term Goals: Engage patient in aftercare planning with referrals and resources, Increase social support, Increase emotional regulation, Facilitate acceptance of mental health diagnosis and concerns, Identify triggers associated with mental health/substance abuse issues, and Increase skills for wellness and recovery  Therapeutic Interventions: Assess for all discharge needs, 1 to 1 time with Social worker, Explore available resources and support systems, Assess for adequacy in community support network, Educate family and significant other(s) on suicide prevention, Complete Psychosocial Assessment, Interpersonal group therapy.  Evaluation of Outcomes: Progressing   Progress in Treatment: Attending groups: No.PT did attend CSW group today 12/31/2022 Participating in groups: No.Pt was minimally active Taking medication as prescribed: Yes. Toleration medication: Yes. Family/Significant other contact made: Yes Earnest Rosier (Mother) Patient understands diagnosis: No. Discussing patient identified problems/goals with staff: Yes. Medical problems stabilized or resolved: Yes. Denies suicidal/homicidal ideation: Yes. Issues/concerns per patient self-inventory: No.    New problem(s) identified: No, Describe:  None Reported   New Short Term/Long Term Goal(s):   Patient Goals:  "  Improve Self Image   Discharge Plan or Barriers: :  Patient recently admitted. CSW will continue to follow and assess for appropriate referrals and possible discharge planning.    Reason for Continuation of Hospitalization: Delusions  Depression Hallucinations Medication  stabilization Suicidal ideation   Estimated Length of Stay: 5-7 Days  Last 3 [place] Suicide Severity Risk Score: Flowsheet Row Admission (Current) [facility] from 12[facility]24/2022 in [facility] ED from 12/07/2021 in [facility] Department at [facility]  C-SSRS RISK CATEGORY No Risk No Risk No Risk       Last PHQ 2/9 Scores:    06/09/2018    9:50 AM 02/19/2017    9:09 AM  Depression screen PHQ 2/9  Decreased Interest 1 3  Down, Depressed, Hopeless 1 3  PHQ - 2 Score 2 6  Altered sleeping 0 3  Tired, decreased energy 1 3  Change in appetite 0 3  Feeling bad or failure about yourself  1 3  Trouble concentrating 0 1  Moving slowly or fidgety/restless 1 3  PHQ-9 Score 5 22    Scribe for Treatment Team: Beather Arbour 12/31/2022 4:26 PM

## 2022-12-31 NOTE — Plan of Care (Signed)
Collateral Call  Patient's mom, Sarah Wade, was contacted for collateral call at 3:40pm on 12/31/22, at this number [phone] .  Patient's current diagnosis was explained as evidenced by specific signs/symptoms.  Assisted parent with understanding implications for treatment based on diagnosis and current level of behaviors/symptoms. Parent asked pertinent questions pertaining to understanding diagnosis and implications for treatment. Reports patient seems more like herself, reports can pick up patient tomorrow at 6:30pm. Reports no gun in the home.  Karie Fetch, MD PGY-2

## 2022-12-31 NOTE — [facility] Group Notes (Signed)
Pt was encouraged but refused to attend group discussion  

## 2022-12-31 NOTE — Group Note (Signed)
Date:  12/31/2022 Time:  9:11 AM  Group Topic/Focus:  Goals Group:   The focus of this group is to help patients establish daily goals to achieve during treatment and discuss how the patient can incorporate goal setting into their daily lives to aide in recovery.    Participation Level:  Did Not Attend  Additional Comments:  Pt was aware there would be a group during this time per the schedule , but, chose not to attend.   Donell Beers 12/31/2022, 9:11 AM
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
## 2022-12-31 NOTE — Group Note (Signed)
[facility] LCSW Group Therapy Note   Group Date: 12/31/2022 Start Time: 1300 End Time: 1400   Type of Therapy/Topic:  Group Therapy:  Emotion Regulation  Participation Level:  Minimal     Description of Group:    The purpose of this group is to assist patients in learning to regulate negative emotions and experience positive emotions. Patients will be guided to discuss ways in which they have been vulnerable to their negative emotions. These vulnerabilities will be juxtaposed with experiences of positive emotions or situations, and patients challenged to use positive emotions to combat negative ones. Special emphasis will be placed on coping with negative emotions in conflict situations, and patients will process healthy conflict resolution skills.  Therapeutic Goals: Patient will identify two positive emotions or experiences to reflect on in order to balance out negative emotions:  Patient will label two or more emotions that they find the most difficult to experience:  Patient will be able to demonstrate positive conflict resolution skills through discussion or role plays:   Summary of Patient Progress:   Patient came to group and accepted the provided worksheets to follow along. Patient shared some insight about topic but then stated that her stomach was hurting and left group.     Therapeutic Modalities:   Cognitive Behavioral Therapy Feelings Identification Dialectical Behavioral Therapy   Sarah Wade, LCSWA

## 2022-12-31 NOTE — Plan of Care (Signed)
  Problem: Activity: Goal: Interest or engagement in activities will improve Outcome: Progressing Goal: Sleeping patterns will improve Outcome: Progressing   Problem: Coping: Goal: Ability to verbalize frustrations and anger appropriately will improve Outcome: Progressing Goal: Ability to demonstrate self-control will improve Outcome: Progressing   Problem: Health Behavior/Discharge Planning: Goal: Identification of resources available to assist in meeting health care needs will improve Outcome: Progressing Goal: Compliance with treatment plan for underlying cause of condition will improve Outcome: Progressing   

## 2022-12-31 NOTE — Progress Notes (Signed)
Recreation Therapy Notes  INPATIENT RECREATION THERAPY ASSESSMENT  Patient Details Name: Lorrena Vanscoyk MRN: [id number] DOB: 2000/10/20 Today's Date: 12/31/2022       Information Obtained From: Patient  Able to Participate in Assessment/Interview: Yes  Patient Presentation: Alert (laying down)  Reason for Admission (Per Patient): Other (Comments) (per chart: paranoia, psychosis, disorganized)  Patient Stressors: Other (Comment) (Not having a job)  Coping Skills:   Journal, Sports, Exercise, Music, Meditate, Deep Breathing, Talk, Prayer, Read  Leisure Interests (2+):  Art - Draw, Crafts - Other (Comment) (Pottery)  Frequency of Recreation/Participation: Weekly  Awareness of Community Resources:  Yes  Community Resources:  [employer], [employer], Engineering geologist  Current Use: Yes  If no, Barriers?:    Expressed Interest in [employer] Information: No  [employer] of Residence:  Engineer, technical sales  Patient Main Form of Transportation: Set designer  Patient Strengths:  Persevering through tough times, observing, being caring  Patient Identified Areas of Improvement:  "not really"  Patient Goal for Hospitalization:  "to have a normal schedule"  Current SI (including self-harm):  No  Current HI:  No  Current AVH: No  Staff Intervention Plan: Group Attendance, Collaborate with Interdisciplinary Treatment Team  Consent to Intern Participation: N/A   Hamlin Devine-McCall, LRT,CTRS Jaymond Waage A Elverna Caffee-McCall 12/31/2022, 2:03 PM

## 2022-12-31 NOTE — Progress Notes (Signed)
   12/31/22 1400  Psych Admission Type (Psych Patients Only)  Admission Status Involuntary  Psychosocial Assessment  Patient Complaints Suspiciousness  Eye Contact Brief  Facial Expression Sad  Affect Depressed;Anxious  Speech Soft  Interaction Isolative  Motor Activity Slow  Appearance/Hygiene Unremarkable  Behavior Characteristics Anxious  Mood Preoccupied  Thought Process  Coherency Circumstantial  Content Preoccupation;Paranoia  Delusions Paranoid  Perception WDL  Hallucination None reported or observed  Judgment Impaired  Confusion None  Danger to Self  Current suicidal ideation? Denies  Danger to Others  Danger to Others None reported or observed

## 2022-12-31 NOTE — Group Note (Signed)
Recreation Therapy Group Note   Group Topic:Healthy Decision Making  Group Date: 12/31/2022 Start Time: 1020 End Time: 1040 Facilitators: Gentry Pilson-McCall, LRT,CTRS Location: [address] Dayroom   Group Topic: Decision Making, Problem Solving, Communication  Goal Area(s) Addresses:  Patient will effectively work with peer towards shared goal.  Patient will identify factors that guided their decision making.  Patient will pro-socially communicate ideas during group session.   Intervention: Survival Scenario - pencil, paper  Group Description: Patients were given a scenario that they were going to be stranded on a deserted [place] for several months before being rescued. Writer tasked them with making a list of 15 things they would choose to bring with them for "survival". The list of items was prioritized most important to least. Each patient would come up with their own list, then work together to create a new list of 15 items while in a group of 3-5 peers. LRT discussed each person's list and how it differed from others. The debrief included discussion of priorities, good decisions versus bad decisions, and how it is important to think before acting so we can make the best decision possible. LRT tied the concept of effective communication among group members to patient's support systems outside of the hospital and its benefit post discharge.  Education: Pharmacist, community, Priorities, Support System, Discharge Planning    Affect/Mood: Depressed   Participation Level: Minimal   Participation Quality: Independent   Behavior: Reluctant and Withdrawn   Speech/Thought Process: Barely audible    Insight: Moderate   Judgement: Moderate   Modes of Intervention: Activity   Patient Response to Interventions:  Attentive   Education Outcome:  In group clarification offered    Clinical Observations/Individualized Feedback: Pt was quiet and appeared depressed. Pt appeared to be attentive  but had no engagement in group. Pt didn't want to share her list during group, so pt gave it to LRT. Some of the things pt identified on her list were seeds to plant food, tent, sleeping bag, water purifier, fish hook, portable heater, mini generator, fish bait, canned food, etc.     Plan: Continue to engage patient in RT group sessions 2-3x/week.   Sidrah Harden-McCall, LRT,CTRS 12/31/2022 1:33 PM

## 2023-01-01 DIAGNOSIS — F250 Schizoaffective disorder, bipolar type: Secondary | ICD-10-CM | POA: Diagnosis not present

## 2023-01-01 MED ORDER — HYDROXYZINE HCL 25 MG PO TABS: 25.0000 mg | ORAL_TABLET | Freq: Three times a day (TID) | ORAL | 0 refills | Status: DC | PRN

## 2023-01-01 MED ORDER — FLUOXETINE HCL 20 MG PO CAPS: 20.0000 mg | ORAL_CAPSULE | Freq: Every day | ORAL | 0 refills | Status: DC

## 2023-01-01 MED ORDER — OLANZAPINE 7.5 MG PO TABS
15.0000 mg | ORAL_TABLET | Freq: Two times a day (BID) | ORAL | Status: DC
  Filled 2023-01-01 (×3): qty 2

## 2023-01-01 MED ORDER — TRAZODONE HCL 50 MG PO TABS: 50.0000 mg | ORAL_TABLET | Freq: Every evening | ORAL | 0 refills | Status: DC | PRN

## 2023-01-01 MED ORDER — OLANZAPINE 15 MG PO TABS: 15.0000 mg | ORAL_TABLET | Freq: Two times a day (BID) | ORAL | 0 refills | Status: DC

## 2023-01-01 NOTE — Progress Notes (Signed)
D: Pt A & O X 3. Denies SI, HI, AVH and pain at this time. D/C home as ordered. Picked up in lobby by her mother.  A: D/C instructions reviewed with pt including prescriptions follow up appointments; compliance encouraged. Pt had no belongings in locker at time of d/c. Scheduled medications given with verbal education and effects monitored. Safety checks maintained without incident till time of d/c.  R: Pt receptive to care. Compliant with medications when offered. Denies adverse drug reactions when assessed. Verbalized understanding related to d/c instructions. Signed belonging sheet in agreement with items received from locker. Ambulatory with a steady gait. Appears to be in no physical distress at time of departure.

## 2023-01-01 NOTE — Progress Notes (Signed)
  [facility] Adult Case Management Discharge Plan :  Will you be returning to the same living situation after discharge:  Yes,  Mom Sarah Wade At discharge, do you have transportation home?: Yes,  Mother Sarah Rosier Do you have the ability to pay for your medications: Yes,  Insured [employer]  Release of information consent forms completed and in the chart;  Patient's signature needed at discharge.  Patient to Follow up at:  Follow-up Information     [facility] Follow up on 01/30/2023.   Specialty: Behavioral Health Why: You have an appointment for medication management services on 01/30/23 at 9:00 am with Dr. Gilmore Laroche.  You also have an appointment for therapy services on 01/31/23 at 9:00 am with Coolidge Breeze.  Both appts are MyChart virtual visits . Contact information: [address] [place] [address] [phone]                Next level of care provider has access to [facility] Link:yes  Safety Planning and Suicide Prevention discussed: Yes,  Mom Sarah Wade     Has patient been referred to the Quitline?: Patient does not use tobacco/nicotine products  Patient has been referred for addiction treatment: No known substance use disorder. Patient to continue working towards treatment goals after discharge. Patient no longer meets criteria for inpatient criteria per attending physician. Continue taking medications as prescribed, nursing to provide instructions at discharge. Follow up with all scheduled appointments.   Shareena Nusz S Clint Strupp, LCSW 01/01/2023, 11:48 AM

## 2023-01-01 NOTE — Discharge Summary (Signed)
Physician Discharge Summary Note  Patient:  Sarah Wade is an 22 y.o., female MRN:  [id number] DOB:  March 07, 2001 Patient phone:  [phone] (home)  Patient address:   [address] [place] [facility] [address],  Total Time spent with patient: 30 minutes  Date of Admission:  12/25/2022 Date of Discharge: 01/01/2023  Reason for Admission:  Sarah Wade is a 22 y.o. female  with a past psychiatric history of schizoaffective disorder bipolar type, cannabis-induced psychosis. Patient initially arrived to [facility] on 9/2 for insomnia and trouble recognizing faces, and admitted to [facility] under IVC on 12/25/22 for acute psychosis. PPHx is significant for no history of Suicide Attempts, Self Injurious Behavior, and 2 Prior Psychiatric Hospitalizations (11/2018, [facility] 01/2021). PMHx is significant for eczema.   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 Accepted to [facility] for inpatient psychiatric treatment.   Psychotherapy hx: [facility] history: None History of suicide (obtained from HPI): history of self-harm, scratch skin with knife 2 weeks ago. Denies suicide attempts. Reports found suicide note in  her phone about putting gasoline on herself and lighting herself on fire. Document cutting leg.  History of homicide or aggression (obtained in HPI): none  Past Medical History:  Past Medical History:  Diagnosis Date   Bipolar affective (HCC)    History reviewed. No pertinent surgical history. Family History: History reviewed. No pertinent family history.  Family Psychiatric  History:  Psychiatric Dx: Maternal aunt with ADHD, bipolar. Mom with depression. Denies family history of schizophrenia.  Suicide Hx: Reports a cousin with children who committed suicide.  Violence/Aggression: Substance use: reports father an alcoholic  Social History:  Social History   Substance and Sexual Activity  Alcohol Use Yes     Social History   Substance and Sexual Activity  Drug Use Yes   Types: Marijuana    Social History   Socioeconomic History   Marital status: Single    Spouse name: Not on file   Number of children: Not on file   Years of education: Not on file   Highest education level: Not on file  Occupational History   Not on file  Tobacco Use   Smoking status: Never   Smokeless tobacco: Never  Substance and Sexual Activity   Alcohol use: Yes   Drug use: Yes    Types: Marijuana   Sexual activity: Not Currently  Other Topics Concern   Not on file  Social History Narrative   Not on file   Social Determinants of Health   Financial Resource Strain: Not on file  Food Insecurity: Patient Unable To Answer (12/25/2022)   Hunger Vital Sign    Worried About Running Out of Food in the Last Year: Patient unable to answer    Ran Out of Food in the Last Year: Patient unable to answer  Transportation Needs: Patient Unable To Answer (12/25/2022)   PRAPARE - Transportation    Lack of  Transportation (Medical): Patient unable to answer    Lack of Transportation (Non-Medical): Patient unable to answer  Physical Activity: Not on file  Stress: Not on file  Social Connections: Not on file     Hospital Course:   During the patient's hospitalization, patient had extensive initial psychiatric evaluation, and follow-up psychiatric evaluations every day.   Psychiatric diagnoses provided upon initial assessment:  Schizoaffective disorder bipolar type, current episode depressed (r/o MDD with psychotic features)    Patient's psychiatric medications were adjusted on admission:   -Start zyprexa 10mg  Q12H for psychosis    During the hospitalization, other adjustments were made to the patient's psychiatric medication regimen:   -- Increase olanzapine disintegrating tablet 15 mg every 12 hours for psychosis  -- Start fluoxetine 20 mg for depressive symptoms and anxiety   Patient's care was discussed during the interdisciplinary team meeting every day during the hospitalization.   The patient denied having side effects to prescribed psychiatric medication.   Gradually, patient started adjusting to milieu. The patient was evaluated each day by a clinical provider to ascertain response to treatment. Improvement was noted by the patient's report of decreasing symptoms, improved sleep and appetite, affect, medication tolerance, behavior, and participation in unit programming.  Patient was asked each day to complete a self inventory noting mood, mental status, pain, new symptoms, anxiety and concerns.     Symptoms were reported as significantly decreased or resolved completely by discharge.    On day of discharge, the patient reports that their mood is stable. The patient denied having suicidal thoughts for more than 48 hours prior to discharge.  Patient denies having homicidal thoughts.  Patient denies having auditory hallucinations.  Patient denies any visual hallucinations or other symptoms of psychosis. The patient was motivated to continue taking medication with a goal of continued improvement in mental health.    Last night went well, took medicine and got snacks. Went to sleep around  9-10pm and woke up around 5am. Felt well rested. Saw mom yesterday. Visit with mom went well. Reports appetite is good. Reports mood overall is good. Feel clearer in the head, mood is better, train of thought is better. Denies paranoia. Denies VH. Denies AH. Reports she was paranoid, isolating in the room. Reports put so much weight on self. Reports last BM was this morning.    The patient reports their target psychiatric symptoms of paranoia, depression responded well to the psychiatric medications, and the patient reports overall benefit other psychiatric hospitalization. Supportive psychotherapy was provided to the patient. The patient also participated in regular group therapy while hospitalized. Coping skills, problem solving as well as relaxation therapies were also part of the unit programming.   Labs were reviewed with the patient, and abnormal results were discussed with the patient.   The patient is able to verbalize their individual safety plan to this provider.   Behavioral Events: none   D/C Medications: prozac, zyprexa  # It is recommended to the patient to continue psychiatric medications as prescribed, after discharge from the hospital.    # It is recommended to the patient to follow up with your outpatient psychiatric provider and PCP.  # It was discussed with the patient, the impact of alcohol, drugs, tobacco have been there overall psychiatric and medical wellbeing, and total abstinence from substance use was recommended to the patient.  # Prescriptions provided or sent directly to preferred pharmacy at discharge. Patient agreeable to plan. Given opportunity to ask questions. Appears to feel comfortable  with discharge.    # In the event of worsening symptoms, the patient is instructed to call the crisis hotline, 911 and or go to the nearest ED for appropriate evaluation and treatment of symptoms. To follow-up with primary care provider for other medical issues, concerns and or  health care needs  # Patient was discharged home with a plan to follow up as noted below.  Physical Findings: AIMS: Facial and Oral Movements Muscles of Facial Expression: None, normal Lips and Perioral Area: None, normal Jaw: None, normal Tongue: None, normal,Extremity Movements Upper (arms, wrists, hands, fingers): None, normal Lower (legs, knees, ankles, toes): None, normal, Trunk Movements Neck, shoulders, hips: None, normal, Overall Severity Severity of abnormal movements (highest score from questions above): None, normal Incapacitation due to abnormal movements: None, normal Patient's awareness of abnormal movements (rate only patient's report): No Awareness, Dental Status Current problems with teeth and/or dentures?: No Does patient usually wear dentures?: No  CIWA:    COWS:     Musculoskeletal: Strength & Muscle Tone: within normal limits Gait & Station: normal Patient leans: N/A  Psychiatric Specialty Exam Presentation  General Appearance: Casual; Fairly Groomed  Eye Contact:Fair  Speech:Normal Rate; Clear and Coherent  Speech Volume:Decreased  Handedness:Right   Mood and Affect  Mood:Anxious  Affect:Restricted   Thought Process  Thought Processes:Linear  Descriptions of Associations:Intact  Orientation:Full (Time, Place and Person)  Thought Content:Logical  History of Schizophrenia/Schizoaffective disorder:No  Duration of Psychotic Symptoms: <6 months Hallucinations:Hallucinations: None  Ideas of Reference:None  Suicidal Thoughts:Suicidal Thoughts: No  Homicidal Thoughts:Homicidal Thoughts: No   Sensorium  Memory:Immediate Good; Recent Good; Remote Good  Judgment:Fair  Insight:Fair   Executive Functions  Concentration:Fair  Attention Span:Fair  Recall:Good  Fund of Knowledge:Good  Language:Good   Psychomotor Activity  Psychomotor Activity:Psychomotor Activity: Normal   Assets  Assets:Resilience; Physical Health;  Social Support; Housing   Sleep  Sleep:Sleep: Fair Number of Hours of Sleep: 9.5    Assets  Assets:Resilience; Physical Health; Social Support; Housing   Physical Exam ROS  Physical Exam Constitutional:      Appearance: the patient is not toxic-appearing.  Pulmonary:     Effort: Pulmonary effort is normal.  Neurological:     General: No focal deficit present.     Mental Status: the patient is alert and oriented to person, place, and time.   Review of Systems  Respiratory:  Negative for shortness of breath.   Cardiovascular:  Negative for chest pain.  Gastrointestinal:  Negative for abdominal pain, constipation, diarrhea, nausea and vomiting.  Neurological:  Negative for headaches.   Blood pressure 108/79, pulse 90, temperature 98.3 F (36.8 C), temperature source Oral, resp. rate 16, height 5\' 4"  (1.626 m), weight 50.8 kg, SpO2 98%. Body mass index is 19.22 kg/m.  Social History   Tobacco Use  Smoking Status Never  Smokeless Tobacco Never   Tobacco Cessation:  N/A, patient does not currently use tobacco products  Blood Alcohol level:  Lab Results  Component Value Date   ETH <10 12/24/2022   ETH <10 02/06/2021    Metabolic Disorder Labs:  Lab Results  Component Value Date   HGBA1C 5.2 12/29/2022   MPG 102.54 12/29/2022   MPG 99.67 02/06/2021   No results found for: "PROLACTIN" Lab Results  Component Value Date   CHOL 160 12/29/2022   TRIG 29 12/29/2022   HDL 64 12/29/2022   CHOLHDL 2.5 12/29/2022   VLDL 6 12/29/2022   LDLCALC 90 12/29/2022   LDLCALC 110 (H)  02/06/2021    Discharge destination:  Home  Is patient on multiple antipsychotic therapies at discharge:  No   Has Patient had three or more failed trials of antipsychotic monotherapy by history:  No  Recommended Plan for Multiple Antipsychotic Therapies: NA  Discharge Instructions     Diet - low sodium heart healthy   Complete by: As directed    Increase activity slowly   Complete  by: As directed       Allergies as of 01/01/2023       Reactions   Tylenol [acetaminophen] Swelling   Nickel Rash        Medication List     TAKE these medications      Indication  FLUoxetine 20 MG capsule Commonly known as: PROZAC Take 1 capsule (20 mg total) by mouth daily.  Indication: Depressive Phase of Manic-Depression   hydrOXYzine 25 MG tablet Commonly known as: ATARAX Take 1 tablet (25 mg total) by mouth 3 (three) times daily as needed for itching or anxiety.  Indication: Feeling Anxious   OLANZapine 15 MG tablet Commonly known as: ZYPREXA Take 1 tablet (15 mg total) by mouth every 12 (twelve) hours. Start taking on: January 02, 2023  Indication: Schizophrenia   traZODone 50 MG tablet Commonly known as: DESYREL Take 1 tablet (50 mg total) by mouth at bedtime as needed for sleep.  Indication: Trouble Sleeping        Follow-up Information     [facility] Follow up on 01/30/2023.   Specialty: Behavioral Health Why: You have an appointment for medication management services on 01/30/23 at 9:00 am with Dr. Gilmore Laroche.  You also have an appointment for therapy services on 01/31/23 at 9:00 am with Coolidge Breeze.  Both appts are MyChart virtual visits . Contact information: [address] [place] [address] [phone]                Discharge recommendations:  Activity: as tolerated  Diet: heart healthy  # It is recommended to the patient to continue psychiatric medications as prescribed, after discharge from the hospital.     # It is recommended to the patient to follow up with your outpatient psychiatric provider -instructions on appointment date, time, and address (location) are provided to you in discharge paperwork  # Follow-up with outpatient primary care doctor and other specialists -for management of chronic medical disease, including:  -eczema  # Testing:  Follow-up with outpatient provider for abnormal lab results:  -none   # It was discussed with the patient, the impact of alcohol, drugs, tobacco have been there overall psychiatric and medical wellbeing, and total abstinence from substance use was recommended to the patient.   # Prescriptions provided or sent directly to preferred pharmacy at discharge. Patient agreeable to plan. Given opportunity to ask questions. Appears to feel comfortable with discharge.    # In the event of worsening symptoms, the patient is instructed to call the crisis hotline, 911 and or go to the nearest ED for appropriate evaluation and treatment of symptoms. To follow-up with primary care provider for other medical issues, concerns and or health care needs  Patient agrees with D/C instructions and plan.   Total Time Spent in Direct Patient Care:  I personally spent 30 minutes on the unit in direct patient care. The direct patient care time included face-to-face time with the patient, reviewing the patient's chart, communicating with other professionals, and coordinating  care. Greater than 50% of this time was spent in counseling or coordinating care with the patient regarding goals of hospitalization, psycho-education, and discharge planning needs.   Signed: Karie Fetch, MD, PGY-2 01/01/2023, 12:58 PM

## 2023-01-01 NOTE — Discharge Instructions (Signed)

## 2023-01-01 NOTE — Group Note (Signed)
Recreation Therapy Group Note   Group Topic:Leisure Education  Group Date: 01/01/2023 Start Time: 1040 End Time: 1115 Facilitators: Byrant Valent-McCall, LRT,CTRS Location: [address] Dayroom   Goal Area(s) Addresses:  Patient will identify positive leisure activities.  Patient will identify one positive benefit of participation in leisure activities.   Intervention: Leisure Group Game  Group Description: Patient and LRT participated in playing a game of [employer]. LRT timed the patients as they hit the beach ball to each other for as long as they can. If the ball were to a complete stop, LRT would start the time over.  Education: Leisure Exposure, Pharmacist, community, Discharge Planning  Education Outcome: Acknowledges education/In group clarification offered/Needs additional education   Affect/Mood: N/A   Participation Level: Did not attend    Clinical Observations/Individualized Feedback:    Plan: Continue to engage patient in RT group sessions 2-3x/week.   Tom Macpherson-McCall, LRT,CTRS 01/01/2023 1:38 PM

## 2023-01-01 NOTE — Plan of Care (Signed)
Patient attended one group session and gave little to no interaction with peers and staff.  Katrinka Herbison-McCall, LRT,CTRS

## 2023-01-01 NOTE — Group Note (Signed)
Date:  01/01/2023 Time:  10:02 AM  Group Topic/Focus:  Goals Group:   The focus of this group is to help patients establish daily goals to achieve during treatment and discuss how the patient can incorporate goal setting into their daily lives to aide in recovery.    Participation Level:  Did Not Attend  Participation Quality:  na  Affect:  na  Cognitive:  na  Insight: None  Engagement in Group:   na  Modes of Intervention:  na  Additional Comments:  na  Octavio Manns 01/01/2023, 10:02 AM

## 2023-01-01 NOTE — Plan of Care (Signed)

## 2023-01-01 NOTE — BHH Suicide Risk Assessment (Signed)
[facility] Discharge Suicide Risk Assessment  Principal Problem: Bipolar disorder, curr episode mixed, severe, w/o psychotic features [facility]) Discharge Diagnoses: Active Problems:   Schizoaffective disorder, bipolar type (HCC)  Reason for admission: Sarah Wade is a 22 y.o. female  with a past psychiatric history of schizoaffective disorder bipolar type, cannabis-induced psychosis. Patient initially arrived to [facility] on 9/2 for insomnia and trouble recognizing faces, and admitted to [facility] under IVC on 12/25/22 for acute psychosis. PPHx is significant for no history of Suicide Attempts, Self Injurious Behavior, and 2 Prior Psychiatric Hospitalizations (11/2018, [facility] 01/2021). PMHx is significant for eczema.   PTA Medications: None  Hospital Course:   During the patient's hospitalization, patient had extensive initial psychiatric evaluation, and follow-up psychiatric evaluations every day.  Psychiatric diagnoses provided upon initial assessment:  Schizoaffective disorder bipolar type, current episode depressed (r/o MDD with psychotic features)   Patient's psychiatric medications were adjusted on admission:   -Start zyprexa 10mg  Q12H for psychosis   During the hospitalization, other adjustments were made to the patient's psychiatric medication regimen:   -- Increase olanzapine disintegrating tablet 15 mg every 12 hours for psychosis  -- Start fluoxetine 20 mg for depressive symptoms and anxiety  Patient's care was discussed during the interdisciplinary team meeting every day during the hospitalization.  The patient denied having side effects to prescribed psychiatric medication.  Gradually, patient started adjusting to milieu. The patient was evaluated each day by a clinical provider to ascertain response to treatment. Improvement was noted by the patient's report of decreasing symptoms, improved sleep and appetite, affect, medication tolerance, behavior, and participation in unit  programming.  Patient was asked each day to complete a self inventory noting mood, mental status, pain, new symptoms, anxiety and concerns.    Symptoms were reported as significantly decreased or resolved completely by discharge.   On day of discharge, the patient reports that their mood is stable. The patient denied having suicidal thoughts for more than 48 hours prior to discharge.  Patient denies having homicidal thoughts.  Patient denies having auditory hallucinations.  Patient denies any visual hallucinations or other symptoms of psychosis. The patient was motivated to continue taking medication with a goal of continued improvement in mental health.   Last night went well, took medicine and got snacks. Went to sleep around 9-10pm and woke up around 5am. Felt well rested. Saw mom yesterday. Visit with mom went well. Reports appetite is good. Reports mood overall is good. Feel clearer in the head, mood is better, train of thought is better. Denies paranoia. Denies VH. Denies AH. Reports she was paranoid, isolating in the room. Reports put so much weight on self. Reports last BM was this morning.   The patient reports their target psychiatric symptoms of paranoia, depression responded well to the psychiatric medications, and the patient reports overall benefit other psychiatric hospitalization. Supportive psychotherapy was provided to the patient. The patient also participated in regular group therapy while hospitalized. Coping skills, problem solving as well as relaxation therapies were also part of the unit programming.  Labs were reviewed with the patient, and abnormal results were discussed with the patient.  The patient is able to verbalize their individual safety plan to this provider.  Behavioral Events: none  D/C Medications: prozac, zyprexa  Sleep  Sleep:Sleep: Good Number of Hours of Sleep: 9.5   Musculoskeletal: Strength & Muscle Tone: within normal limits Gait & Station:  normal Patient leans: N/A  Psychiatric Specialty Exam  Presentation  General Appearance:  Appropriate  for Environment  Eye Contact: Good  Speech: Clear and Coherent  Speech Volume: Normal  Handedness: Right   Mood and Affect  Mood: -- (Feeling better)  Affect: Restricted   Thought Process  Thought Processes: Coherent  Descriptions of Associations: Intact  Orientation: Full (Time, Place and Person)  Thought Content: WDL  History of Schizophrenia/Schizoaffective disorder: Yes  Duration of Psychotic Symptoms: <6 months Hallucinations: Hallucinations: None  Ideas of Reference: None  Suicidal Thoughts: Suicidal Thoughts: No  Homicidal Thoughts: Homicidal Thoughts: No   Sensorium  Memory: Immediate Fair; Recent Fair; Remote Fair  Judgment: Fair  Insight: Fair   Art therapist  Concentration: Fair  Attention Span: Fair  Recall: [employer] of Knowledge: Fair  Language: Fair   Psychomotor Activity  Psychomotor Activity: Psychomotor Activity: Normal   Assets  Assets: Resilience; Physical Health; Social Support; Housing   Sleep  Sleep: Sleep: Good Number of Hours of Sleep: 9.5    Assets  Assets: Resilience; Physical Health; Social Support; Housing  Physical Exam ROS  Physical Exam Constitutional:      Appearance: the patient is not toxic-appearing.  Pulmonary:     Effort: Pulmonary effort is normal.  Neurological:     General: No focal deficit present.     Mental Status: the patient is alert and oriented to person, place, and time.   Review of Systems  Respiratory:  Negative for shortness of breath.   Cardiovascular:  Negative for chest pain.  Gastrointestinal:  Negative for abdominal pain, constipation, diarrhea, nausea and vomiting.  Neurological:  Negative for headaches.    Blood pressure 108/79, pulse 90, temperature 98.3 F (36.8 C), temperature source Oral, resp. rate 16, height 5\' 4"   (1.626 m), weight 50.8 kg, SpO2 98%. Body mass index is 19.22 kg/m.  Mental Status Per Nursing Assessment::   On Admission:  Self-harm behaviors  Demographic Factors:  Adolescent or young adult  Loss Factors: Loss of significant relationship  Historical Factors: Impulsivity  Risk Reduction Factors:   Sense of responsibility to family, Living with another person, especially a relative, Positive social support, and Positive coping skills or problem solving skills  Continued Clinical Symptoms:  Schizoaffective disorder, bipolar type  Cognitive Features That Contribute To Risk:  Loss of executive function    Suicide Risk:  Mild:  There are no identifiable plans, no associated intent, mild dysphoria and related symptoms, good self-control (both objective and subjective assessment), few other risk factors, and identifiable protective factors, including available and accessible social support.   Follow-up Information     [facility] Follow up on 01/30/2023.   Specialty: Behavioral Health Why: You have an appointment for medication management services on 01/30/23 at 9:00 am with Dr. Gilmore Laroche.  You also have an appointment for therapy services on 01/31/23 at 9:00 am with Coolidge Breeze.  Both appts are MyChart virtual visits . Contact information: [address] [place] [address] [phone]                Discharge recommendations:    Activity: as tolerated  Diet: heart healthy  # It is recommended to the patient to continue psychiatric medications as prescribed, after discharge from the hospital.     # It is recommended to the patient to follow up with your outpatient psychiatric provider -instructions on appointment date, time, and address (location) are provided to you in discharge paperwork  # Follow-up with outpatient primary care doctor and  other specialists -for management of chronic  medical disease, including:  -eczema  # Testing: Follow-up with outpatient provider for abnormal lab results:  -none   # It was discussed with the patient, the impact of alcohol, drugs, tobacco have been there overall psychiatric and medical wellbeing, and total abstinence from substance use was recommended to the patient.   # Prescriptions provided or sent directly to preferred pharmacy at discharge. Patient agreeable to plan. Given opportunity to ask questions. Appears to feel comfortable with discharge.    # In the event of worsening symptoms, the patient is instructed to call the crisis hotline, 911 and or go to the nearest ED for appropriate evaluation and treatment of symptoms. To follow-up with primary care provider for other medical issues, concerns and or health care needs  Patient agrees with D/C instructions and plan.   Total Time Spent in Direct Patient Care:  I personally spent 30 minutes on the unit in direct patient care. The direct patient care time included face-to-face time with the patient, reviewing the patient's chart, communicating with other professionals, and coordinating care. Greater than 50% of this time was spent in counseling or coordinating care with the patient regarding goals of hospitalization, psycho-education, and discharge planning needs.   Karie Fetch, MD, PGY-2 01/01/2023, 8:48 AM

## 2023-01-01 NOTE — Progress Notes (Signed)
Recreation Therapy Notes  INPATIENT RECREATION TR PLAN  Patient Details Name: Tesha Folger MRN: [id number] DOB: July 20, 2000 Today's Date: 01/01/2023  Rec Therapy Plan Is patient appropriate for Therapeutic Recreation?: Yes Treatment times per week: about 3 days Estimated Length of Stay: 5-7 days TR Treatment/Interventions: Group participation (Comment)  Discharge Criteria Pt will be discharged from therapy if:: Discharged Treatment plan/goals/alternatives discussed and agreed upon by:: Patient/family  Discharge Summary Short term goals set: See patient care plan Short term goals met: Not met Progress toward goals comments: Groups attended Which groups?: Other (Comment) (Decision Making) Reason goals not met: Pt attended one group and minimal to no interaction with peers/staff. Therapeutic equipment acquired: N/A Reason patient discharged from therapy: Discharge from hospital Pt/family agrees with progress & goals achieved: Yes Date patient discharged from therapy: 01/01/23   Laith Antonelli-McCall, LRT,CTRS Lamia Mariner A Osualdo Hansell-McCall 01/01/2023, 1:56 PM

## 2023-01-15 ENCOUNTER — Other Ambulatory Visit (HOSPITAL_COMMUNITY): Payer: Self-pay | Admitting: *Deleted

## 2023-01-15 ENCOUNTER — Telehealth (HOSPITAL_COMMUNITY): Payer: Self-pay | Admitting: *Deleted

## 2023-01-15 MED ORDER — HYDROXYZINE HCL 25 MG PO TABS: 25.0000 mg | ORAL_TABLET | Freq: Three times a day (TID) | ORAL | 0 refills | Status: DC | PRN

## 2023-01-15 NOTE — Telephone Encounter (Signed)
Hydroxyzine 25 mg done co sign

## 2023-01-15 NOTE — Telephone Encounter (Signed)
Refill Request --  hydrOXYzine (ATARAX) 25 MG tablet  [id number] [place], [place] - [address] (Ph: [phone])   Next Appt 01/30/23

## 2023-01-29 ENCOUNTER — Telehealth (HOSPITAL_COMMUNITY): Payer: Self-pay | Admitting: Psychiatry

## 2023-01-29 NOTE — Telephone Encounter (Signed)
Called to confirm patient's intent to keep appointments on 10/9 (psychiatry) and 10/10 (therapy). No answer. Call went directly to voicemail. Voicemail box has not been set up.

## 2023-01-30 ENCOUNTER — Telehealth (INDEPENDENT_AMBULATORY_CARE_PROVIDER_SITE_OTHER): Payer: 59 | Admitting: Psychiatry

## 2023-01-30 ENCOUNTER — Encounter (HOSPITAL_COMMUNITY): Payer: Self-pay | Admitting: Psychiatry

## 2023-01-30 DIAGNOSIS — F23 Brief psychotic disorder: Secondary | ICD-10-CM | POA: Diagnosis not present

## 2023-01-30 DIAGNOSIS — F250 Schizoaffective disorder, bipolar type: Secondary | ICD-10-CM | POA: Diagnosis not present

## 2023-01-30 MED ORDER — FLUOXETINE HCL 20 MG PO CAPS: 20.0000 mg | ORAL_CAPSULE | Freq: Every day | ORAL | 1 refills | Status: DC

## 2023-01-30 MED ORDER — OLANZAPINE 15 MG PO TABS: 15.0000 mg | ORAL_TABLET | Freq: Two times a day (BID) | ORAL | 1 refills | Status: DC

## 2023-01-30 NOTE — Progress Notes (Signed)
Psychiatric Initial Adult Assessment   Patient Identification: Sarah Wade MRN:  [id number] Date of Evaluation:  01/30/2023 Referral Source: hospital discharge Chief Complaint:   Chief Complaint  Patient presents with   Establish Care   Visit Diagnosis:    ICD-10-CM   1. 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  Location: Patient: home Provider: home office   I discussed the limitations of evaluation and management by telemedicine and the availability of in person appointments. The patient expressed understanding and agreed to proceed.      I discussed the assessment and treatment plan with the patient. The patient was provided an opportunity to ask questions and all were answered. The patient agreed with the plan and demonstrated an understanding of the instructions.   The patient was advised to call back or seek an in-person evaluation if the symptoms worsen or if the condition fails to improve as anticipated.  I provided 60 minutes of non-face-to-face time during this encounter incld    History of Present Illness:  As per last discharge notes "Sarah Wade is a 22 y.o. female  with a past psychiatric history of schizoaffective disorder bipolar type, cannabis-induced psychosis. Patient initially arrived to [facility] on 9/2 for insomnia and trouble recognizing faces, and admitted to [facility] under IVC on 12/25/22 for acute psychosis. PPHx is significant for no history of Suicide Attempts, Self Injurious Behavior, and 2 Prior Psychiatric Hospitalizations (11/2018, [facility] 01/2021). PMHx is significant for eczema."  Chart reviewed.  Patient currently living with her mom and her grandparents describes her  hospital admission because of poor sleep and states that she was getting confused and was having racing mind and hearing voices which were putting her down she was also experiencing paranoia she is describing that  more so because of poor sleep and she was feeling hopeless and she got admitted in the hospital.  Since hospital discharge she is doing somewhat better she feels tired during the day she is taking olanzapine 2 times a day she is not endorsing voices but she still feels some paranoia as if people are watching her she has poor eye contact tries to avoid looking at people and feel paranoia around at times when she is going out  She feels medication is helping she does understand medication compliance is important as in the past she has been admitted to 3 times and every time she stops taking the medication she has an episode.  In regarding depression she feels somewhat subdued tired decreased energy she feels medication keeping her prior not so much depressed but on the downside meaning a motivation she is taking 1 during the day and 1 at night and remains compliant.  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  Recent diagnosis of schizoaffective bipolar type AH: improved, paranoia better but still there  On evaluation  today otherwise remains cooperative not agitated feels somewhat down but more soft a motivation rather than hopelessness  Aggravating factors; recurrent episodes, finances  Modifying factors family, mom, reading  Duration more than for 5 years  Severity better since hospital admission  Past Psychiatric  History: schizoaffective disorder, brief psychotic disorder  Previous Psychotropic Medications: Yes   Substance Abuse History in the last 12 months:  No.  Consequences of Substance Abuse: NA  Past Medical History:  Past Medical History:  Diagnosis Date   Bipolar affective (HCC)    No past surgical history on file.  Family Psychiatric History: mom : depression  Family History: No family history on file.  Social History:   Social History   Socioeconomic History   Marital status: Single    Spouse name: Not on file   Number of children: Not on file   Years of education: Not on file   Highest education level: Not on file  Occupational History   Not on file  Tobacco Use   Smoking status: Never   Smokeless tobacco: Never  Substance and Sexual Activity   Alcohol use: Yes   Drug use: Yes    Types: Marijuana   Sexual activity: Not Currently  Other Topics Concern   Not on file  Social History Narrative   Not on file   Social Determinants of Health   Financial Resource Strain: Not on file  Food Insecurity: Patient Unable To Answer (12/25/2022)   Hunger Vital Sign    Worried About Running Out of Food in the Last Year: Patient unable to answer    Ran Out of Food in the Last Year: Patient unable to answer  Transportation Needs: Patient Unable To Answer (12/25/2022)   PRAPARE - Administrator, Civil Service (Medical): Patient unable to answer    Lack of Transportation (Non-Medical): Patient unable to answer  Physical Activity: Not on file  Stress: Not on file  Social Connections: Not on file    Additional Social History: grew up with mom and grand parents, did finish school. Says some challenges at home  Allergies:   Allergies  Allergen Reactions   Tylenol [Acetaminophen] Swelling   Nickel Rash    Metabolic Disorder Labs: Lab Results  Component Value Date   HGBA1C 5.2 12/29/2022   MPG 102.54 12/29/2022   MPG 99.67 02/06/2021   No results found for:  "PROLACTIN" Lab Results  Component Value Date   CHOL 160 12/29/2022   TRIG 29 12/29/2022   HDL 64 12/29/2022   CHOLHDL 2.5 12/29/2022   VLDL 6 12/29/2022   LDLCALC 90 12/29/2022   LDLCALC 110 (H) 02/06/2021   Lab Results  Component Value Date   TSH 1.631 12/29/2022    Therapeutic Level Labs: No results found for: "LITHIUM" No results found for: "CBMZ" No results found for: "VALPROATE"  Current Medications: Current Outpatient Medications  Medication Sig Dispense Refill   FLUoxetine (PROZAC) 20 MG capsule Take 1 capsule (20 mg total) by mouth daily. 30 capsule 1   hydrOXYzine (ATARAX) 25 MG tablet Take 1 tablet (25 mg total) by mouth 3 (three) times daily as needed for itching or anxiety. 30 tablet 0   OLANZapine (ZYPREXA) 15 MG tablet Take 1 tablet (15 mg total) by mouth every 12 (twelve) hours. 60 tablet 1   traZODone (DESYREL) 50 MG tablet Take 1 tablet (50 mg total) by mouth at bedtime as needed for sleep. 30 tablet 0   No current facility-administered medications for this  visit.     Psychiatric Specialty Exam: Review of Systems  Cardiovascular:  Negative for chest pain.  Neurological:  Negative for tremors.  Psychiatric/Behavioral:  Negative for hallucinations and self-injury.     There were no vitals taken for this visit.There is no height or weight on file to calculate BMI.  General Appearance: Casual  Eye Contact:  Poor  Speech:  Slow  Volume:  Decreased  Wade:   somewhat subdued  Affect:  Congruent  Thought Process:  Linear  Orientation:  Full (Time, Place, and Person)  Thought Content:  Paranoid Ideation and Rumination  Suicidal Thoughts:  No  Homicidal Thoughts:  No  Memory:  Immediate;   Fair  Judgement:  Other:  shallow  Insight:  Shallow  Psychomotor Activity:  Decreased  Concentration:  Concentration: Fair  Recall:  Fair  Fund of Knowledge:Fair  Language: Fair  Akathisia:  No  Handed:    AIMS (if indicated):  no involuntary movements  Assets:   Desire for Improvement Housing  ADL's:  Intact  Cognition: WNL  Sleep:  Fair   Screenings: AIMS    Flowsheet Row [facility]  AIMS Total Score 0      GAD-7    Flowsheet Row Office Visit from 06/06/2018 in [facility] from 02/19/2017 in [facility]  Total GAD-7 Score 9 19      PHQ2-9    Flowsheet Row Video Visit from 01/30/2023 in [facility] Office Visit from 06/06/2018 in [facility] from 02/19/2017 in [facility]  PHQ-2 Total Score 2 2 6   PHQ-9 Total Score 9 5 22       Flowsheet Row Video Visit from 01/30/2023 in [facility] Admission (Discharged) [facility] ED from 12/24/2022 in [facility] at [facility]  C-SSRS RISK CATEGORY Error: Question 2 not populated No Risk No Risk       Assessment and Plan: as follows  Schizoaffective disorder depressed type; she has remained compliant for the last 1 month since her hospital admission in September.  Discussed to continue medication There is no tremors or associated side effects Zyprexa 15 mg twice a day we will continue and down the road we may consider to lower the dose down once she continues to remain stable but experiencing tiredness.  Continue prozac 20mg  for depression   Patient recent labs did not show use of marijuana she has had marijuana use in the past with a past diagnosis of cannabis induced psychosis also with a past diagnosis of brief psychotic disorder  During today's  appointment we tried to develop therapeutic alliance so that she remains compliant and comfortable with the medication and follow-ups which are highly important in her case with history of noncompliance and recurrent admission  Patient is not suicidal has a good support system of family medication reviewed questions were addressed  Insomnia; she was taking trazodone but she still feeling tired and olanzapine is much helpful for her sleep she is not taking trazodone on a regular basis that can be discontinued continue to work on sleep hygiene to  avoid naps during the day so that she can have more consolidated sleep at night  Discussed to abstain from any drugs or marijuana and its effect to psychosis and judjement  FU 4 weeks or earlier and also is scheduled for therapy   Collaboration of Care: Psychiatrist AEB hospital discharge and notes reviewed  Patient/Guardian was advised Release of Information must be obtained prior to any record release in order to collaborate their care with an outside provider. Patient/Guardian was advised if they have not already done so to contact the registration department to sign all necessary forms in order for [place] to release information regarding their care.   Consent: Patient/Guardian gives verbal consent for treatment and assignment of benefits for services provided during this visit. Patient/Guardian expressed understanding and agreed to proceed.   Thresa Ross, MD 10/9/20249:38 AM

## 2023-01-31 ENCOUNTER — Encounter (HOSPITAL_COMMUNITY): Payer: Self-pay

## 2023-01-31 ENCOUNTER — Ambulatory Visit (INDEPENDENT_AMBULATORY_CARE_PROVIDER_SITE_OTHER): Payer: Self-pay | Admitting: Licensed Clinical Social Worker

## 2023-01-31 DIAGNOSIS — Z0389 Encounter for observation for other suspected diseases and conditions ruled out: Secondary | ICD-10-CM

## 2023-01-31 NOTE — Progress Notes (Signed)
 Patient did not show for assessment

## 2023-02-27 ENCOUNTER — Telehealth (HOSPITAL_COMMUNITY): Payer: Self-pay

## 2023-02-27 NOTE — Telephone Encounter (Signed)
Medication mangement - Message left for patient to remind her of her scheduled virtual visit appointment set for Dr. Lynnae Sandhoff Friday 03/01/23 at 10:00 am. Requested if patient needed any refills prior to this appointment to call our office back with request.

## 2023-02-27 NOTE — Telephone Encounter (Signed)
Medication refills - [facility] also [facility] [address] for a 90 day order of her prescribed Trazodone 50 mg, and Fluoxetine 20 mg, along with her Olanzapine 15 mg. Patient is scheduled for appt 03/01/23.

## 2023-02-27 NOTE — Telephone Encounter (Signed)
Medication refill - Fax [facility] [address] requesting a 90 day order of patient's prescribed Olanzapine 15 mg, one every 12 hours, last ordered 01/30/23 + 1 refill. Patient returns 03/01/23 for next appointment.

## 2023-02-28 ENCOUNTER — Other Ambulatory Visit: Payer: Self-pay

## 2023-02-28 ENCOUNTER — Emergency Department (HOSPITAL_COMMUNITY)
Admission: EM | Admit: 2023-02-28 | Discharge: 2023-03-02 | Disposition: A | Discharge status: Other Acute Inpatient Hospital | Payer: 59 | Attending: Emergency Medicine | Admitting: Emergency Medicine

## 2023-02-28 DIAGNOSIS — E876 Hypokalemia: Secondary | ICD-10-CM | POA: Diagnosis not present

## 2023-02-28 DIAGNOSIS — T50902A Poisoning by unspecified drugs, medicaments and biological substances, intentional self-harm, initial encounter: Secondary | ICD-10-CM | POA: Diagnosis present

## 2023-02-28 DIAGNOSIS — D72829 Elevated white blood cell count, unspecified: Secondary | ICD-10-CM | POA: Diagnosis not present

## 2023-02-28 DIAGNOSIS — Z20822 Contact with and (suspected) exposure to covid-19: Secondary | ICD-10-CM | POA: Diagnosis not present

## 2023-02-28 DIAGNOSIS — F315 Bipolar disorder, current episode depressed, severe, with psychotic features: Secondary | ICD-10-CM | POA: Diagnosis not present

## 2023-02-28 DIAGNOSIS — I4581 Long QT syndrome: Secondary | ICD-10-CM | POA: Insufficient documentation

## 2023-02-28 DIAGNOSIS — Y900 Blood alcohol level of less than 20 mg/100 ml: Secondary | ICD-10-CM | POA: Insufficient documentation

## 2023-02-28 DIAGNOSIS — R9431 Abnormal electrocardiogram [ECG] [EKG]: Secondary | ICD-10-CM

## 2023-02-28 DIAGNOSIS — R45851 Suicidal ideations: Secondary | ICD-10-CM | POA: Diagnosis not present

## 2023-02-28 DIAGNOSIS — R4789 Other speech disturbances: Secondary | ICD-10-CM | POA: Diagnosis not present

## 2023-02-28 LAB — COMPREHENSIVE METABOLIC PANEL
ALT: 43 U/L (ref 0–44)
AST: 31 U/L (ref 15–41)
Albumin: 4.4 g/dL (ref 3.5–5.0)
Alkaline Phosphatase: 64 U/L (ref 38–126)
Anion gap: 16 — ABNORMAL HIGH (ref 5–15)
BUN: 10 mg/dL (ref 6–20)
CO2: 19 mmol/L — ABNORMAL LOW (ref 22–32)
Calcium: 9 mg/dL (ref 8.9–10.3)
Chloride: 101 mmol/L (ref 98–111)
Creatinine, Ser: 0.88 mg/dL (ref 0.44–1.00)
GFR, Estimated: >60 mL/min (ref >60)
Glucose, Bld: 166 mg/dL — ABNORMAL HIGH (ref 70–99)
Potassium: 2.1 mmol/L — CL (ref 3.5–5.1)
Sodium: 136 mmol/L (ref 135–145)
Total Bilirubin: 1.5 mg/dL — ABNORMAL HIGH (ref <1.2)
Total Protein: 7.3 g/dL (ref 6.5–8.1)

## 2023-02-28 LAB — POTASSIUM: Potassium: 3.5 mmol/L (ref 3.5–5.1)

## 2023-02-28 LAB — ACETAMINOPHEN LEVEL: Acetaminophen (Tylenol), Serum: <10 ug/mL — ABNORMAL LOW (ref 10–30)

## 2023-02-28 LAB — CBC
HCT: 36.6 % (ref 36.0–46.0)
Hemoglobin: 12.3 g/dL (ref 12.0–15.0)
MCH: 29.2 pg (ref 26.0–34.0)
MCHC: 33.6 g/dL (ref 30.0–36.0)
MCV: 86.9 fL (ref 80.0–100.0)
Platelets: 212 10*3/uL (ref 150–400)
RBC: 4.21 MIL/uL (ref 3.87–5.11)
RDW: 13.5 % (ref 11.5–15.5)
WBC: 12.2 10*3/uL — ABNORMAL HIGH (ref 4.0–10.5)
nRBC: 0 % (ref 0.0–0.2)

## 2023-02-28 LAB — HCG, SERUM, QUALITATIVE: Preg, Serum: NEGATIVE

## 2023-02-28 LAB — SALICYLATE LEVEL: Salicylate Lvl: <7 mg/dL — ABNORMAL LOW (ref 7.0–30.0)

## 2023-02-28 LAB — MAGNESIUM: Magnesium: 1.8 mg/dL (ref 1.7–2.4)

## 2023-02-28 LAB — ETHANOL: Alcohol, Ethyl (B): <10 mg/dL (ref <10)

## 2023-02-28 MED ORDER — POTASSIUM CHLORIDE 10 MEQ/100ML IV SOLN
10.0000 meq | INTRAVENOUS | Status: AC
Start: 2023-02-28 — End: 2023-03-01
  Administered 2023-02-28: 10 meq via INTRAVENOUS
  Filled 2023-02-28: qty 100

## 2023-02-28 MED ORDER — SODIUM CHLORIDE 0.9 % IV BOLUS
1000.0000 mL | Freq: Once | INTRAVENOUS | Status: AC
  Administered 2023-02-28: 1000 mL via INTRAVENOUS

## 2023-02-28 MED ORDER — POTASSIUM CHLORIDE 10 MEQ/100ML IV SOLN
10.0000 meq | INTRAVENOUS | Status: DC
  Administered 2023-02-28: 10 meq via INTRAVENOUS
  Filled 2023-02-28: qty 100

## 2023-02-28 NOTE — ED Notes (Signed)
Dr. Denton Lank evaluating patient at triage 3.

## 2023-02-28 NOTE — ED Triage Notes (Addendum)
Patient arrived with family , intentional overdose of unknown amount of Fluoxetine ; Trazodone and Olanzepine this evening , history of depression/Bipolar disease . She denies SI or HI at triage .

## 2023-02-28 NOTE — ED Provider Notes (Signed)
[place] EMERGENCY DEPARTMENT AT [facility] Provider Note   CSN: [id number] Arrival date & time: 02/28/23  1925    History  Chief Complaint  Patient presents with   Intentional Overdose    Sarah Wade is a 22 y.o. female history of bipolar, depression here for evaluation of intentional overdose.  Mother states patient told her she took a handful of pills because she was "sad."  She lives with her mother.  States typically the medications make her very sleepy subsequently she stopped taking them a few days ago.  Her symptoms worsened today which is when this happened.  Mother states she denies any prior history of SI or HI.  Has had depressive episodes.  She has been eating and drinking normally at home.  No sick contacts.  No illicit substances.   Per behavioral note patient has history of schizoaffective disorder  It seems she is on olanzapine, 15 mg once every 12 hours Trazodone 50 mg Fluoxetine 20 mg  She was post appointment tomorrow to discuss with her psychiatrist  Patient and family do not know the time or the amount of medications she took  Patient denies any complaints at this time.  She denies any SI, HI however states she was "sad" and that is why she took the medications       HPI     Home Medications Prior to Admission medications   Medication Sig Start Date End Date Taking? Authorizing Provider  FLUoxetine (PROZAC) 20 MG capsule Take 1 capsule (20 mg total) by mouth daily. 01/30/23 03/31/23  Thresa Ross, MD  hydrOXYzine (ATARAX) 25 MG tablet Take 1 tablet (25 mg total) by mouth 3 (three) times daily as needed for itching or anxiety. 01/15/23   Thresa Ross, MD  OLANZapine (ZYPREXA) 15 MG tablet Take 1 tablet (15 mg total) by mouth every 12 (twelve) hours. 01/30/23 03/31/23  Thresa Ross, MD  traZODone (DESYREL) 50 MG tablet Take 1 tablet (50 mg total) by mouth at bedtime as needed for sleep. 01/01/23   Massengill, Harrold Donath, MD       Allergies    Tylenol [acetaminophen] and Nickel    Review of Systems   Review of Systems  Constitutional: Negative.   HENT: Negative.    Respiratory: Negative.    Cardiovascular: Negative.   Gastrointestinal: Negative.   Genitourinary: Negative.   Musculoskeletal: Negative.   Skin: Negative.   Neurological: Negative.   All other systems reviewed and are negative.   Physical Exam Updated Vital Signs BP (!) 110/57   Pulse (!) 102   Temp 98.1 F (36.7 C)   Resp (!) 22   SpO2 96%  Physical Exam Vitals and nursing note reviewed.  Constitutional:      General: She is not in acute distress.    Appearance: She is well-developed. She is not ill-appearing, toxic-appearing or diaphoretic.     Comments: sleepy  HENT:     Head: Atraumatic.  Eyes:     Pupils: Pupils are equal, round, and reactive to light.  Cardiovascular:     Rate and Rhythm: Normal rate.     Pulses: Normal pulses.     Heart sounds: Normal heart sounds.  Pulmonary:     Effort: Pulmonary effort is normal. No respiratory distress.     Breath sounds: Normal breath sounds.  Abdominal:     General: Bowel sounds are normal. There is no distension.     Palpations: Abdomen is soft.  Musculoskeletal:  General: Normal range of motion.     Cervical back: Normal range of motion.  Skin:    General: Skin is warm and dry.  Neurological:     General: No focal deficit present.     Mental Status: She is alert.  Psychiatric:        Attention and Perception: Attention and perception normal.        Mood and Affect: Mood normal. Affect is flat.        Speech: Speech is delayed.        Behavior: Behavior is slowed.        Thought Content: Thought content is not paranoid or delusional. Thought content does not include homicidal or suicidal ideation. Thought content does not include homicidal or suicidal plan.    ED Results / Procedures / Treatments   Labs (all labs ordered are listed, but only abnormal results are  displayed) Labs Reviewed  COMPREHENSIVE METABOLIC PANEL - Abnormal; Notable for the following components:      Result Value   Potassium 2.1 (*)    CO2 19 (*)    Glucose, Bld 166 (*)    Total Bilirubin 1.5 (*)    Anion gap 16 (*)    All other components within normal limits  SALICYLATE LEVEL - Abnormal; Notable for the following components:   Salicylate Lvl <7.0 (*)    All other components within normal limits  ACETAMINOPHEN LEVEL - Abnormal; Notable for the following components:   Acetaminophen (Tylenol), Serum <10 (*)    All other components within normal limits  CBC - Abnormal; Notable for the following components:   WBC 12.2 (*)    All other components within normal limits  ETHANOL  HCG, SERUM, QUALITATIVE  MAGNESIUM  POTASSIUM  RAPID URINE DRUG SCREEN, HOSP PERFORMED  ACETAMINOPHEN LEVEL  I-STAT CHEM 8, ED    EKG EKG Interpretation Date/Time:  Thursday February 28 2023 19:26:19 EST Ventricular Rate:  104 PR Interval:  130 QRS Duration:  96 QT Interval:  434 QTC Calculation: 570 R Axis:   91  Text Interpretation: Sinus tachycardia Rightward axis Incomplete right bundle branch block Nonspecific ST abnormality Prolonged QT Abnormal ECG When compared with ECG of 25-Dec-2022 11:42, Since prior ECG, rate has increased, QTc has lengthened Confirmed by Alvira Monday ([id number]) on 02/28/2023 10:35:13 PM  Radiology No results found.  Procedures Procedures    Medications Ordered in ED Medications  potassium chloride 10 mEq in 100 mL IVPB (has no administration in time range)  sodium chloride 0.9 % bolus 1,000 mL (1,000 mLs Intravenous New Bag/Given 02/28/23 2204)    ED Course/ Medical Decision Making/ A&P   22 year old here for evaluation of intentional overdose.  States she took these medications because she was "sad" however denies any overt SI, HI.  Mother states patient has history of bipolar and depression.  She went off of some of her psychiatric medications a few  days ago because she said they are making her sleepy.  Patient and family do not know when exactly these medications were ingested or how much.  She is a little sleepy here however protecting her airway.  She moves all 4 extremities.  Denies any illicit substance use, EtOH use.  Will plan on labs, discussed with poison control.  Labs personally viewed and interpreted:  CBC leukocytosis 12.2 Ethanol, salicylate, acetaminophen less than 10 CMP potassium 2.1--suspect this is an erroneous lab, will get i-STAT Chem-8 Stat Chem-8 shows potassium 3.1, sodium 138--will replete potassium per  poison control recommendation CT is negative Magnesium 1.8 Potassium 3.5  Poison control recommends keeping potassium at higher end of normal.  Total obs time 68 hours from time of ingestion, recommend repeat Tylenol 4 hours.  Potassium 3.5 after I bag IV K.  Will continue to second bag given her prolonged QT interval on EKG.  Patient will need to be obs until 0300, repeat tylenol at 2345. After that she will be medically cleared for TTS consult.                               Medical Decision Making Amount and/or Complexity of Data Reviewed Independent Historian: parent External Data Reviewed: labs, radiology, ECG and notes. Labs: ordered. Decision-making details documented in ED Course. Radiology: ordered and independent interpretation performed. Decision-making details documented in ED Course. ECG/medicine tests: ordered and independent interpretation performed. Decision-making details documented in ED Course.  Risk OTC drugs. Prescription drug management. Parenteral controlled substances. Decision regarding hospitalization. Diagnosis or treatment significantly limited by social determinants of health.         Final Clinical Impression(s) / ED Diagnoses Final diagnoses:  Intentional overdose, initial encounter (HCC)  Hypokalemia  Prolonged Q-T interval on ECG    Rx / DC Orders ED  Discharge Orders     None         Kimberly Nieland A, PA-C 02/28/23 2345    Alvira Monday, MD 03/01/23 1146

## 2023-02-28 NOTE — ED Notes (Signed)
Sitter at bedside.

## 2023-02-28 NOTE — ED Provider Notes (Signed)
Off her psych med, has long QT.  Repeat tylenol at 11:45.  Obs until 3am and she'll medically cleared.  K+ 2.1 is not a true potassium as repeat potassium is 3.5.    Pt is medically cleared and can be manage further by psychiatry.  Due to help on QT, patient was also given magnesium as well as potassium supplementation.  Psych will evaluate patient at 6:30 AM.  BP (!) 149/123   Pulse 99   Temp 98 F (36.7 C) (Oral)   Resp (!) 23   SpO2 100%   Results for orders placed or performed during the hospital encounter of 02/28/23  Comprehensive metabolic panel  Result Value Ref Range   Sodium 136 135 - 145 mmol/L   Potassium 2.1 (LL) 3.5 - 5.1 mmol/L   Chloride 101 98 - 111 mmol/L   CO2 19 (L) 22 - 32 mmol/L   Glucose, Bld 166 (H) 70 - 99 mg/dL   BUN 10 6 - 20 mg/dL   Creatinine, Ser 8.46 0.44 - 1.00 mg/dL   Calcium 9.0 8.9 - 96.2 mg/dL   Total Protein 7.3 6.5 - 8.1 g/dL   Albumin 4.4 3.5 - 5.0 g/dL   AST 31 15 - 41 U/L   ALT 43 0 - 44 U/L   Alkaline Phosphatase 64 38 - 126 U/L   Total Bilirubin 1.5 (H) <1.2 mg/dL   GFR, Estimated >95 >28 mL/min   Anion gap 16 (H) 5 - 15  Ethanol  Result Value Ref Range   Alcohol, Ethyl (B) <10 <10 mg/dL  Salicylate level  Result Value Ref Range   Salicylate Lvl <7.0 (L) 7.0 - 30.0 mg/dL  Acetaminophen level  Result Value Ref Range   Acetaminophen (Tylenol), Serum <10 (L) 10 - 30 ug/mL  cbc  Result Value Ref Range   WBC 12.2 (H) 4.0 - 10.5 K/uL   RBC 4.21 3.87 - 5.11 MIL/uL   Hemoglobin 12.3 12.0 - 15.0 g/dL   HCT 41.3 24.4 - 01.0 %   MCV 86.9 80.0 - 100.0 fL   MCH 29.2 26.0 - 34.0 pg   MCHC 33.6 30.0 - 36.0 g/dL   RDW 27.2 53.6 - 64.4 %   Platelets 212 150 - 400 K/uL   nRBC 0.0 0.0 - 0.2 %  Rapid urine drug screen (hospital performed)  Result Value Ref Range   Opiates NONE DETECTED NONE DETECTED   Cocaine NONE DETECTED NONE DETECTED   Benzodiazepines NONE DETECTED NONE DETECTED   Amphetamines NONE DETECTED NONE DETECTED    Tetrahydrocannabinol NONE DETECTED NONE DETECTED   Barbiturates NONE DETECTED NONE DETECTED  hCG, serum, qualitative  Result Value Ref Range   Preg, Serum NEGATIVE NEGATIVE  Magnesium  Result Value Ref Range   Magnesium 1.8 1.7 - 2.4 mg/dL  Potassium  Result Value Ref Range   Potassium 3.5 3.5 - 5.1 mmol/L  Acetaminophen level  Result Value Ref Range   Acetaminophen (Tylenol), Serum <10 (L) 10 - 30 ug/mL   No results found.     Fayrene Helper, PA-C 03/01/23 0347    Sabas Sous, MD 03/01/23 [phone]

## 2023-02-28 NOTE — ED Notes (Addendum)
Pt roomed at this time, very drowsy, states she does not know the time of ingestion, nor the amount of pills she ingested.   Changed out into burgundy scrubs and wanded, all belongings placed in pt belonging bag, includes: - pink dress, pink shirt, brown slippers, pink sweat band  All inventoried and placed into [place] [facility]

## 2023-02-28 NOTE — ED Notes (Addendum)
Poison Control called at this time, spoke to [place]. Per recommendations, expect to see: - prolonged QtC, and if QtC is >500 replacing K and Mg to higher end of normal - watch for seizures - watch for ventricular dysrhythmias and hypotension - avoid zyprexa, Haldol or zofran - monitor temperatures, can cause hyperthermia Suggestion: Add Mg  Total Obs time: 6-8hrs from time of ingestion

## 2023-03-01 ENCOUNTER — Telehealth (HOSPITAL_COMMUNITY): Payer: 59 | Admitting: Psychiatry

## 2023-03-01 ENCOUNTER — Encounter (HOSPITAL_COMMUNITY): Payer: Self-pay | Admitting: Psychiatric/Mental Health

## 2023-03-01 ENCOUNTER — Encounter (HOSPITAL_COMMUNITY): Payer: Self-pay

## 2023-03-01 DIAGNOSIS — F315 Bipolar disorder, current episode depressed, severe, with psychotic features: Secondary | ICD-10-CM | POA: Diagnosis not present

## 2023-03-01 DIAGNOSIS — T50902A Poisoning by unspecified drugs, medicaments and biological substances, intentional self-harm, initial encounter: Secondary | ICD-10-CM | POA: Diagnosis not present

## 2023-03-01 LAB — RESP PANEL BY RT-PCR (RSV, FLU A&B, COVID)  RVPGX2
Influenza A by PCR: NEGATIVE
Influenza B by PCR: NEGATIVE
Resp Syncytial Virus by PCR: NEGATIVE
SARS Coronavirus 2 by RT PCR: NEGATIVE

## 2023-03-01 LAB — I-STAT CHEM 8, ED
BUN: 11 mg/dL (ref 6–20)
Calcium, Ion: 1.14 mmol/L — ABNORMAL LOW (ref 1.15–1.40)
Chloride: 102 mmol/L (ref 98–111)
Creatinine, Ser: 0.8 mg/dL (ref 0.44–1.00)
Glucose, Bld: 125 mg/dL — ABNORMAL HIGH (ref 70–99)
HCT: 36 % (ref 36.0–46.0)
Hemoglobin: 12.2 g/dL (ref 12.0–15.0)
Potassium: 3.1 mmol/L — ABNORMAL LOW (ref 3.5–5.1)
Sodium: 138 mmol/L (ref 135–145)
TCO2: 22 mmol/L (ref 22–32)

## 2023-03-01 LAB — ACETAMINOPHEN LEVEL: Acetaminophen (Tylenol), Serum: <10 ug/mL — ABNORMAL LOW (ref 10–30)

## 2023-03-01 LAB — RAPID URINE DRUG SCREEN, HOSP PERFORMED
Amphetamines: NOT DETECTED
Barbiturates: NOT DETECTED
Benzodiazepines: NOT DETECTED
Cocaine: NOT DETECTED
Opiates: NOT DETECTED
Tetrahydrocannabinol: NOT DETECTED

## 2023-03-01 LAB — POTASSIUM: Potassium: 4.2 mmol/L (ref 3.5–5.1)

## 2023-03-01 LAB — MAGNESIUM: Magnesium: 3.8 mg/dL — ABNORMAL HIGH (ref 1.7–2.4)

## 2023-03-01 MED ORDER — POTASSIUM CHLORIDE CRYS ER 20 MEQ PO TBCR
40.0000 meq | EXTENDED_RELEASE_TABLET | Freq: Once | ORAL | Status: DC
  Filled 2023-03-01: qty 2

## 2023-03-01 MED ORDER — OLANZAPINE 5 MG PO TABS
15.0000 mg | ORAL_TABLET | Freq: Two times a day (BID) | ORAL | Status: DC
  Administered 2023-03-01 – 2023-03-02 (×2): 15 mg via ORAL
  Filled 2023-03-01 (×2): qty 1

## 2023-03-01 MED ORDER — POTASSIUM CHLORIDE 20 MEQ PO PACK: 60.0000 meq | PACK | ORAL | Status: DC

## 2023-03-01 MED ORDER — POTASSIUM CHLORIDE 20 MEQ PO PACK
80.0000 meq | PACK | ORAL | Status: AC
  Administered 2023-03-01: 80 meq via ORAL
  Filled 2023-03-01: qty 4

## 2023-03-01 MED ORDER — FLUOXETINE HCL 20 MG PO CAPS
20.0000 mg | ORAL_CAPSULE | Freq: Every day | ORAL | Status: DC
  Administered 2023-03-01 – 2023-03-02 (×2): 20 mg via ORAL
  Filled 2023-03-01 (×2): qty 1

## 2023-03-01 MED ORDER — MAGNESIUM SULFATE 2 GM/50ML IV SOLN
2.0000 g | Freq: Once | INTRAVENOUS | Status: AC
  Administered 2023-03-01: 2 g via INTRAVENOUS
  Filled 2023-03-01: qty 50

## 2023-03-01 NOTE — ED Notes (Signed)
TTS in progress, mother at bedside   

## 2023-03-01 NOTE — ED Notes (Signed)
Per Weber NP, can give prosac and zyprexa

## 2023-03-01 NOTE — ED Notes (Signed)
Called lab to have magnesium added since it wasn't added on already.

## 2023-03-01 NOTE — ED Provider Notes (Signed)
Repeat EKG wnl and QT  improved.  Mag and K are normal.  Pt is medically cleared at this time.   Gwyneth Sprout, MD 03/01/23 2207

## 2023-03-01 NOTE — ED Notes (Signed)
Patient has not been med cleared by Poison control; Pt needs to have QTC <500 to clear; EDP and Charge notified; EKG to be repeated at 11pm per Poison control request-Monique,RN

## 2023-03-01 NOTE — ED Notes (Signed)
Spoke with Kendal Hymen from [employer] control who has cleared patient on their end and is closing the case; EDP notified-Monique,RN

## 2023-03-01 NOTE — ED Notes (Signed)
Called lab to have magnesium added to previous sample sent down at 1532, per lab they will add it on

## 2023-03-01 NOTE — ED Notes (Signed)
Pt's mother called and Diplomatic Services operational officer notified this Charity fundraiser. Notified pt about her mother asking how she is doing and offered pt to be able to call her after she eats lunch and pt is agreeable to call her mom once finished eating. Updated pt that we are waiting for an inpatient psychiatric bed. Pt verbalized understanding.

## 2023-03-01 NOTE — Progress Notes (Signed)
Pt has been accepted to [facility] on 03/01/2023 Bed assignment: [facility]   Pt meets inpatient criteria per: Tyler Aas NP   Attending Physician will be: Dr. Forrestine Him MD  Report can be called to: [phone]  Pt can arrive anytime today   Care Team Notified: Denton Ar RN   [place] Glendy Barsanti LCSW-A   03/01/2023 2:09 PM

## 2023-03-01 NOTE — ED Notes (Signed)
Pt ambulatory to purple zone w/ IV in arm. IV to be removed.

## 2023-03-01 NOTE — ED Notes (Signed)
Notified BH NP about pt being prescribed zyprexa and [facility] recommendations are for it to be prn, asked for meds to be looked over for clarification.

## 2023-03-01 NOTE — ED Notes (Signed)
Pt placed on cardiac monitor 

## 2023-03-01 NOTE — ED Provider Notes (Signed)
Patient was brought back to psychiatry unit.  Was asked about clearance for patient.  She did have a potassium that was 2.1 yesterday and only received 20 mill equivalents of IV potassium.  Will give her dose of 80 mill equivalents p.o. KCl at this time and recheck her potassium in the afternoon.  Will hold off on psychiatric medications due to her prolonged QT.   Rondel Baton, MD 03/01/23 [phone]

## 2023-03-01 NOTE — ED Notes (Addendum)
Spoke w/ poison control. Discussed QTC, Mg level, VS Recommendations: -Repeat EKG around 6pm, d/t QTC >500 -Repeat Mg level and treat if needed, Poison control wants Mg level on the high end of normal  VS: 92/54 (66) HR 77, pt sleepy Explained that per provider zyprexa was given this morning. Poison control believes that is contributing to increased QTC and drowsiness and low BP

## 2023-03-01 NOTE — Progress Notes (Signed)
This CSW was informed that pt is now medically clear. CSW followed back up with [facility] and spoke with Elmarie Shiley from [facility] who informed that pt can now transfer. Care team notified.   Maryjean Ka, MSW, [facility] 03/01/2023 11:57 PM

## 2023-03-01 NOTE — ED Notes (Signed)
RN advised Dr. Anitra Lauth that current QT is 394 and Poison control will be notified to med clear patient-Monique,RN

## 2023-03-01 NOTE — Progress Notes (Signed)
LCSW Progress Note  [id number]   Quamesha Paller  03/01/2023  1:59 PM  Description:   Inpatient Psychiatric Referral  Patient was recommended inpatient Tyler Aas NP There are no available beds at [facility], per [facility] RN Patient was referred to the following out of network facilities:   Destination  Service Provider Address Phone Fax  [facility]  [address] [place] [address] [phone]  [facility]  [address] [place] [address] [phone]  [facility]  [address] [place] [address] [phone]  [facility] [address]  [address] [place] [address] [phone]  [facility]  [address] [place], [place] [address] [phone]  [facility]  [address] [place] [address] [phone]  [facility]  [address] [place] [address] [phone]  [facility]  [address] [place] [address] [phone]  [facility]  [address], [place] [address] [phone]  [facility]  [address] [place] [address] [phone]  [facility]  [address], [place] [address] [phone]  [facility]  [address], [place] [address] [phone]  [facility]  [address] [place] [address] [phone]  [facility] EFA[facility]  [address] [place], [place] [phone]  [facility]  [address] [place] [address] [phone]  [facility]  [address] [place] [address] [phone]   [facility]  [address], [place] [address] [phone]  [facility]  [address], [place] [address] [phone]  [facility]  [address] [place] [address] [phone]  [facility]  [place] [phone]  [facility] EFAX  [place] [phone]      Situation ongoing, CSW to continue following and update chart as more information becomes available.      [place] Jemaine Prokop LCSW-A  03/01/2023 1:59 PM

## 2023-03-01 NOTE — ED Notes (Addendum)
Received phone call from poison control. Per poison control:  -Repeat potassium level -Repeat magnesium level -Goal QTC is <500 -Obtain EKG around 1400-1500  Notified Eloise Harman, MD, and [facility] NP [facility])

## 2023-03-01 NOTE — Consult Note (Signed)
[facility] Consult Note  Patient Name: Sarah Wade MRN: [id number] DOB: Dec 06, 2000 DATE OF Consult: 03/01/2023  PRIMARY PSYCHIATRIC DIAGNOSES  1.  Bipolar Disorder, current episode depressed, severe, with psychotic features 2.  Intentional Overdose   RECOMMENDATIONS  Recommendations: Medication recommendations:  -- Hold all psychotropic medications at this time given overdose -- Olanzapine 5mg  PO/IM Q6H PRN for acute agitation  Non-Medication/therapeutic recommendations:  -- QTC 552. Monitor QTC, avoid medication that may cause further QTC prolongation. Only use antipsychotics for severe agitation.  -- Patient currently agreeable for VOL admission however if not VOL, she meets criteria for IVC petition.   Is inpatient psychiatric hospitalization recommended for this patient? -- Yes, patient is an imminent danger to self- suicide attempt prior to arrival   Communication: Treatment team members (and family members if applicable) who were involved in treatment/care discussions and planning, and with whom we spoke or engaged with via secure text/chat, include the following: ED primary team  Thank you for involving [place] in the care of this patient. If you have any additional questions or concerns, please call [phone] and ask for me or the provider on-call.  TELEPSYCHIATRY ATTESTATION & CONSENT  As the provider for this telehealth consult, I attest that I verified the patient's identity using two separate identifiers, introduced myself to the patient, provided my credentials, disclosed my location, and performed this encounter via a HIPAA-compliant, real-time, face-to-face, two-way, interactive audio and video platform and with the full consent and agreement of the patient (or guardian as applicable.)  Patient physical location: ED in [facility]. Telehealth provider physical location: home office in state of [place].  Video start time: 0527 (Central Time) Video  end time: 0559 (Central Time)   IDENTIFYING DATA  Sarah Wade is a 23 y.o. year-old female for whom a psychiatric consultation has been ordered by the primary provider. The patient was identified using two separate identifiers.  CHIEF COMPLAINT/REASON FOR CONSULT  Intentional Overdose  HISTORY OF PRESENT ILLNESS (HPI)  The patient is a 22yo female who presented to the emergency department, accompanied by family, following an intentional overdose of an unknown amount of Fluoxetine, Trazodone, and Olanzapine last evening. History of Depression and Bipolar Disorder.   Patient evaluated with Mother present at bedside. Patient is calm and cooperative however lethargic, somnolent, delayed and slowed speech noted. Psychomotor slowing observed. Patient states "everybody is out to get me". Patient states she feels "damaged" and "bad" daily. Endorses helplessness, hopelessness, worthlessness. Admits last night she attempted to kill herself by taking three handfuls of Trazodone, Hydroxyzine, and Fluoxetine. States she didn't have any Olanzapine left in the bottle. States she poured the medicine in a cup and ingested it. Patient states now she feels "horrible, drained". In the last few weeks patient has not been able to sleep, reports decreased appetite. Stopped taking her medication about 1.5 weeks ago due to oversedation, daytime somnolence. When off her medication she experiences "mania".   Mother states episodes usually are triggered by a female individual. Mother states patient has been trying to reconnect with an old friend but this female "doesn't want a relationship". Patient has issues with her father, always trying to please others that are not in her life. Mother felt her medication was putting her in a "better head space" but did notice an increase in lethargy. Mother states this is the first time patient has attempted to kill herself. Has spoken about passive SI but no plan.   Patient is remorseful for  her  attempt lat night. Is thankful to be alive this morning. Denies active SI/HI. However endorses CAH telling her to kill herself. Mother is not sure if she experiences hallucinations, mother tends to believe it is the patient's inner monologue.   Patient continues to repeat that she is very depressed, she believes others are talking about her, are out to get her and the "grim reaper" is watching her.      PAST PSYCHIATRIC HISTORY  Per chart review, recent behavioral health admission 12/25/2022- 01/01/2023 for Bipolar Disorder, current episode mixed, severe, without psychotic features. Patient was discharged on Prozac 20mg  po daily, Hydroxyzine 25mg  TID PRN for anxiety, Olanzapine 15 mg Q12H, and Trazodone 50mg  at bedtime PRN for sleep.   Attends [facility] for outpatient services.  This is her first suicide attempt    Otherwise as per HPI above.  PAST MEDICAL HISTORY  Past Medical History:  Diagnosis Date   Bipolar affective [facility])      HOME MEDICATIONS  Facility Ordered Medications  Medication   [COMPLETED] sodium chloride 0.9 % bolus 1,000 mL   [EXPIRED] potassium chloride 10 mEq in 100 mL IVPB   FLUoxetine (PROZAC) capsule 20 mg   OLANZapine (ZYPREXA) tablet 15 mg   potassium chloride SA (KLOR-CON M) CR tablet 40 mEq   [COMPLETED] magnesium sulfate IVPB 2 g 50 mL   PTA Medications  Medication Sig   traZODone (DESYREL) 50 MG tablet Take 1 tablet (50 mg total) by mouth at bedtime as needed for sleep.   hydrOXYzine (ATARAX) 25 MG tablet Take 1 tablet (25 mg total) by mouth 3 (three) times daily as needed for itching or anxiety.   OLANZapine (ZYPREXA) 15 MG tablet Take 1 tablet (15 mg total) by mouth every 12 (twelve) hours.   FLUoxetine (PROZAC) 20 MG capsule Take 1 capsule (20 mg total) by mouth daily.     ALLERGIES  Allergies  Allergen Reactions   Tylenol [Acetaminophen] Swelling   Nickel Rash    SOCIAL & SUBSTANCE USE HISTORY  Social History   Socioeconomic  History   Marital status: Single    Spouse name: Not on file   Number of children: Not on file   Years of education: Not on file   Highest education level: Not on file  Occupational History   Not on file  Tobacco Use   Smoking status: Never   Smokeless tobacco: Never  Substance and Sexual Activity   Alcohol use: Yes   Drug use: Yes    Types: Marijuana   Sexual activity: Not Currently  Other Topics Concern   Not on file  Social History Narrative   Not on file   Social Determinants of Health   Financial Resource Strain: Not on file  Food Insecurity: Patient Unable To Answer (12/25/2022)   Hunger Vital Sign    Worried About Running Out of Food in the Last Year: Patient unable to answer    Ran Out of Food in the Last Year: Patient unable to answer  Transportation Needs: Patient Unable To Answer (12/25/2022)   PRAPARE - Transportation    Lack of Transportation (Medical): Patient unable to answer    Lack of Transportation (Non-Medical): Patient unable to answer  Physical Activity: Not on file  Stress: Not on file  Social Connections: Not on file   Social History   Tobacco Use  Smoking Status Never  Smokeless Tobacco Never   Social History   Substance and Sexual Activity  Alcohol Use Yes  Social History   Substance and Sexual Activity  Drug Use Yes   Types: Marijuana    Additional pertinent information Lives with Mother.  FAMILY HISTORY  History reviewed. No pertinent family history. Family Psychiatric History (if known):  Denies   MENTAL STATUS EXAM (MSE)  Presentation  General Appearance:  Appropriate for Environment  Eye Contact: Poor  Speech: Slow  Speech Volume: Decreased  Handedness: Right   Mood and Affect  Mood: Depressed  Affect: Flat (somnolent)   Thought Process  Thought Processes: Linear (disorganized at times)  Descriptions of Associations: Intact  Orientation: Full (Time, Place and Person)  Thought Content: Scattered;  Logical  History of Schizophrenia/Schizoaffective disorder: No  Duration of Psychotic Symptoms: Less than six months  Hallucinations:Hallucinations: Auditory Description of Auditory Hallucinations: command type auditory hallucinations telling her to kill herself  Ideas of Reference: Paranoia  Suicidal Thoughts:Suicidal Thoughts: Yes, Passive SI Passive Intent and/or Plan: With Intent; With Means to Carry Out  Homicidal Thoughts:Homicidal Thoughts: No   Sensorium  Memory: Immediate Fair  Judgment: Poor  Insight: Poor   Executive Functions  Concentration: Poor  Attention Span: Poor  Recall: Poor  Fund of Knowledge: Poor  Language: Fair   Psychomotor Activity  Psychomotor Activity:Psychomotor Activity: Decreased  Assets  Assets: Communication Skills; Desire for Improvement; Housing; Social Support; Physical Health   Sleep  Sleep:Sleep: Poor   VITALS  Blood pressure (!) 106/55, pulse 98, temperature 98 F (36.7 C), temperature source Oral, resp. rate 20, SpO2 100%.  LABS  Admission on 02/28/2023  Component Date Value Ref Range Status   Sodium 02/28/2023 136  135 - 145 mmol/L Final   Potassium 02/28/2023 2.1 (LL)  3.5 - 5.1 mmol/L Final   CRITICAL RESULT CALLED TO, READ BACK BY AND VERIFIED WITH Kennedy Bucker RN [phone] M. ALAMANO   Chloride 02/28/2023 101  98 - 111 mmol/L Final   CO2 02/28/2023 19 (L)  22 - 32 mmol/L Final   Glucose, Bld 02/28/2023 166 (H)  70 - 99 mg/dL Final   Glucose reference range applies only to samples taken after fasting for at least 8 hours.   BUN 02/28/2023 10  6 - 20 mg/dL Final   Creatinine, Ser 02/28/2023 0.88  0.44 - 1.00 mg/dL Final   Calcium 04/54/0981 9.0  8.9 - 10.3 mg/dL Final   Total Protein 19/14/7829 7.3  6.5 - 8.1 g/dL Final   Albumin 56/21/3086 4.4  3.5 - 5.0 g/dL Final   AST 57/84/6962 31  15 - 41 U/L Final   ALT 02/28/2023 43  0 - 44 U/L Final   Alkaline Phosphatase 02/28/2023 64  38 - 126 U/L Final    Total Bilirubin 02/28/2023 1.5 (H)  <1.2 mg/dL Final   GFR, Estimated 02/28/2023 >60  >60 mL/min Final   Comment: (NOTE) Calculated using the CKD-EPI Creatinine Equation (2021)    Anion gap 02/28/2023 16 (H)  5 - 15 Final   Performed at [facility], [address] [place], [place] [address]   Alcohol, Ethyl (B) 02/28/2023 <10  <10 mg/dL Final   Comment: (NOTE) Lowest detectable limit for serum alcohol is 10 mg/dL.  For medical purposes only. Performed at [facility], [address], [place], [place] [address]    Salicylate Lvl 02/28/2023 <7.0 (L)  7.0 - 30.0 mg/dL Final   Performed at [facility], [address] [place], [place] [address]   Acetaminophen (Tylenol), Serum 02/28/2023 <10 (L)  10 -  30 ug/mL Final   Comment: (NOTE) Therapeutic concentrations vary significantly. A range of 10-30 ug/mL  may be an effective concentration for many patients. However, some  are best treated at concentrations outside of this range. Acetaminophen concentrations >150 ug/mL at 4 hours after ingestion  and >50 ug/mL at 12 hours after ingestion are often associated with  toxic reactions.  Performed at [facility], [address], [place], [place] [address]    WBC 02/28/2023 12.2 (H)  4.0 - 10.5 K/uL Final   RBC 02/28/2023 4.21  3.87 - 5.11 MIL/uL Final   Hemoglobin 02/28/2023 12.3  12.0 - 15.0 g/dL Final   HCT 95/28/4132 36.6  36.0 - 46.0 % Final   MCV 02/28/2023 86.9  80.0 - 100.0 fL Final   MCH 02/28/2023 29.2  26.0 - 34.0 pg Final   MCHC 02/28/2023 33.6  30.0 - 36.0 g/dL Final   RDW 44/04/270 13.5  11.5 - 15.5 % Final   Platelets 02/28/2023 212  150 - 400 K/uL Final   nRBC 02/28/2023 0.0  0.0 - 0.2 % Final   Performed at [facility], [address] [place], [place] [address]   Opiates 03/01/2023 NONE DETECTED  NONE DETECTED Final   Cocaine 03/01/2023 NONE DETECTED  NONE DETECTED Final   Benzodiazepines 03/01/2023 NONE DETECTED  NONE DETECTED  Final   Amphetamines 03/01/2023 NONE DETECTED  NONE DETECTED Final   Tetrahydrocannabinol 03/01/2023 NONE DETECTED  NONE DETECTED Final   Barbiturates 03/01/2023 NONE DETECTED  NONE DETECTED Final   Comment: (NOTE) DRUG SCREEN FOR MEDICAL PURPOSES ONLY.  IF CONFIRMATION IS NEEDED FOR ANY PURPOSE, NOTIFY LAB WITHIN 5 DAYS.  LOWEST DETECTABLE LIMITS FOR URINE DRUG SCREEN Drug Class                     Cutoff (ng/mL) Amphetamine and metabolites    1000 Barbiturate and metabolites    200 Benzodiazepine                 200 Opiates and metabolites        300 Cocaine and metabolites        300 THC                            50 Performed at [facility], [address] [place], [place] [address]    Preg, Serum 02/28/2023 NEGATIVE  NEGATIVE Final   Comment:        THE SENSITIVITY OF THIS METHODOLOGY IS >10 mIU/mL. Performed at [facility], [address], [place], [place] [address]    Magnesium 02/28/2023 1.8  1.7 - 2.4 mg/dL Final   Performed at [facility], [address] [place], [place] [address]   Potassium 02/28/2023 3.5  3.5 - 5.1 mmol/L Final   Performed at [facility], [address] [place], [place] [address]   Acetaminophen (Tylenol), Serum 03/01/2023 <10 (L)  10 - 30 ug/mL Final   Comment: (NOTE) Therapeutic concentrations vary significantly. A range of 10-30 ug/mL  may be an effective concentration for many patients. However, some  are best treated at concentrations outside of this range. Acetaminophen concentrations >150 ug/mL at 4 hours after ingestion  and >50 ug/mL at 12 hours after ingestion are often associated with  toxic reactions.  Performed at [facility], [address] [place], [place] [address]  PSYCHIATRIC REVIEW OF SYSTEMS (ROS)  ROS: Notable for the following relevant positive findings: Review of Systems  Psychiatric/Behavioral:  Positive for depression, hallucinations and suicidal ideas.     Additional  findings:      Musculoskeletal: No abnormal movements observed      Gait & Station: Laying/Sitting      Pain Screening: Denies      Nutrition & Dental Concerns: Decrease in food intake and/or loss of appetite  RISK FORMULATION/ASSESSMENT  Is the patient experiencing any suicidal or homicidal ideations: Yes       Explain if yes: suicide attempt prior to arrival  Protective factors considered for safety management: access to care, housing, family/ social support  Risk factors/concerns considered for safety management: age, psychosis, suicide attempt  Depression Hopelessness Unmarried  Is there a Astronomer plan with the patient and treatment team to minimize risk factors and promote protective factors: Yes           Explain: Patient currently in the ED, medication management, comfort measures, monitoring of QTC, inpatient psychiatric stabilization  Is crisis care placement or psychiatric hospitalization recommended: Yes     Based on my current evaluation and risk assessment, patient is determined at this time to be at:  High risk  *RISK ASSESSMENT Risk assessment is a dynamic process; it is possible that this patient's condition, and risk level, may change. This should be re-evaluated and managed over time as appropriate. Please re-consult psychiatric consult services if additional assistance is needed in terms of risk assessment and management. If your team decides to discharge this patient, please advise the patient how to best access emergency psychiatric services, or to call 911, if their condition worsens or they feel unsafe in any way.   Assunta Gambles, NP Telepsychiatry Consult Services

## 2023-03-01 NOTE — ED Notes (Signed)
EKG given to [facility] MD per poison control recommendations

## 2023-03-02 NOTE — ED Notes (Signed)
Called [facility] for patient pick up.

## 2023-03-02 NOTE — ED Notes (Signed)
Called [facility] to give report at [phone], no answer.

## 2023-03-02 NOTE — Progress Notes (Signed)
Per [facility] transport is unable to transport pt until 1st shift to [employer].   Maryjean Ka, MSW, LCSWA 03/02/2023 12:26 AM

## 2023-03-02 NOTE — ED Provider Notes (Signed)
Emergency Medicine Observation Re-evaluation Note  Sarah Wade is a 22 y.o. female, seen on rounds today.  Pt initially presented to the ED for complaints of Intentional Overdose Currently, the patient is in room without complaint  Physical Exam  BP 94/60   Pulse 84   Temp 98.3 F (36.8 C) (Oral)   Resp 17   SpO2 100%  Physical Exam Vitals and nursing note reviewed.  Constitutional:      General: She is not in acute distress.    Appearance: She is well-developed.  HENT:     Head: Normocephalic and atraumatic.  Eyes:     Conjunctiva/sclera: Conjunctivae normal.  Cardiovascular:     Rate and Rhythm: Normal rate and regular rhythm.     Heart sounds: No murmur heard. Pulmonary:     Effort: Pulmonary effort is normal. No respiratory distress.     Breath sounds: Normal breath sounds.  Abdominal:     Palpations: Abdomen is soft.     Tenderness: There is no abdominal tenderness.  Musculoskeletal:        General: No swelling.     Cervical back: Neck supple.  Skin:    General: Skin is warm and dry.     Capillary Refill: Capillary refill takes less than 2 seconds.  Neurological:     Mental Status: She is alert.  Psychiatric:        Mood and Affect: Mood normal.      ED Course / MDM  EKG:EKG Interpretation Date/Time:  Friday March 01 2023 10:55:53 EST Ventricular Rate:  99 PR Interval:  128 QRS Duration:  82 QT Interval:  384 QTC Calculation: 492 R Axis:   87  Text Interpretation: Normal sinus rhythm Prolonged QT Abnormal ECG Confirmed by Vonita Moss [phone]) on 03/01/2023 11:34:20 AM  I have reviewed the labs performed to date as well as medications administered while in observation.  Recent changes in the last 24 hours include normalization of QTc.  Labs drawn and reviewed.  Plan  Current plan is for transfer today to facility.    Anders Simmonds T, DO 03/02/23 1113

## 2023-03-02 NOTE — ED Notes (Signed)
Spoke to [place] at [facility], she advised that nightshift took report, I advised her I wanted to verify and that [facility] had been called and the patient would be leaving soon.

## 2023-03-02 NOTE — ED Notes (Signed)
Attempt to call report to [facility], no answer.

## 2023-03-02 NOTE — ED Notes (Signed)
Patient left with [facility] with her belongings.

## 2023-03-02 NOTE — ED Notes (Signed)
Patient will be transported to [facility] in the AM-Monique,RN

## 2023-03-02 NOTE — ED Notes (Signed)
Called [name] to verify patient bed status, was advised she still has a bed was transferred to the nursing department to give report no one answered.  Called [phone] to give report and no one answered.

## 2023-03-15 ENCOUNTER — Encounter (HOSPITAL_COMMUNITY): Payer: Self-pay

## 2023-03-28 ENCOUNTER — Other Ambulatory Visit (HOSPITAL_COMMUNITY): Payer: Self-pay | Admitting: Psychiatry

## 2023-04-01 ENCOUNTER — Telehealth (HOSPITAL_COMMUNITY): Payer: Self-pay | Admitting: *Deleted

## 2023-04-01 ENCOUNTER — Telehealth (HOSPITAL_COMMUNITY): Payer: 59 | Admitting: Psychiatry

## 2023-04-01 ENCOUNTER — Encounter (HOSPITAL_COMMUNITY): Payer: Self-pay | Admitting: Psychiatry

## 2023-04-01 DIAGNOSIS — F251 Schizoaffective disorder, depressive type: Secondary | ICD-10-CM

## 2023-04-01 DIAGNOSIS — F250 Schizoaffective disorder, bipolar type: Secondary | ICD-10-CM

## 2023-04-01 MED ORDER — ARIPIPRAZOLE 15 MG PO TABS: 15.0000 mg | ORAL_TABLET | Freq: Every day | ORAL | 1 refills | Status: DC

## 2023-04-01 NOTE — Telephone Encounter (Signed)
MOM CALLED STATED DAUGHTER WAS TOLD BY YOU TO HAVE MOM  CALL # [phone]

## 2023-04-01 NOTE — Progress Notes (Addendum)
[facility] Follow up visit  Patient Identification: Sarah Wade MRN:  [id number] Date of Evaluation:  04/01/2023 Referral Source: hospital discharge Chief Complaint:   No chief complaint on file. Follow up hospital discharge Visit Diagnosis:    ICD-10-CM   1. Schizoaffective disorder, bipolar type (HCC)  F25.0     Virtual Visit via Video Note  I connected with Sarah Wade on 04/01/23 at  1:30 PM EST by a video enabled telemedicine application and verified that I am speaking with the correct person using two identifiers.  Location: Patient: home Provider: home office   I discussed the limitations of evaluation and management by telemedicine and the availability of in person appointments. The patient expressed understanding and agreed to proceed.      I discussed the assessment and treatment plan with the patient. The patient was provided an opportunity to ask questions and all were answered. The patient agreed with the plan and demonstrated an understanding of the instructions.   The patient was advised to call back or seek an in-person evaluation if the symptoms worsen or if the condition fails to improve as anticipated.  I provided 20 minutes of non-face-to-face time during this encounter.      History of Present Illness:  As per last discharge notes "Sarah Wade is a 22 y.o. female  with a past psychiatric history of schizoaffective disorder bipolar type, cannabis-induced psychosis. As per previous charts "Patient initially arrived to [facility] on 9/2 for insomnia and trouble recognizing faces, and admitted to [facility] under IVC on 12/25/22 for acute psychosis. PPHx is significant for no history of Suicide Attempts, Self Injurious Behavior, and 2 Prior Psychiatric Hospitalizations (11/2018, [facility] 01/2021). PMHx is significant for eczema."  Last visit was after admission and compliance was discussed was doing some better on olanzapine was feeling paranoia but no  AH  Apparently she has got another [facility] last [facility] says was feeling down, OD on olanzapine and meds. Now mom keeps meds, she was discharged after a week but remains vague of admission notes or diagnosis. MOm not there nor paper work of her discharge with her.  States taking abilify and that helps, still on prozac  Has stated she slipped in [facility] at times. Understand it make her paranoia worse  Stays at home gets anxious and paranoid around people or avoids  Mom is keeping meds since last admission.  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  Recent diagnosis of schizoaffective bipolar type   Not agitated but somewhat vague of wanting to keep taking meds   Aggravating factors; recurrent episodes, finances  Modifying factors family, mom, reading  Duration more than for 5 years  Severity; recurrent admission, some paranoia   Past Psychiatric History: schizoaffective disorder, brief psychotic disorder  Previous Psychotropic Medications: Yes   Substance Abuse History in the last 12 months:  No.  Consequences of Substance Abuse: NA  Past Medical History:  Past Medical History:  Diagnosis Date   Bipolar affective (HCC)    History reviewed. No pertinent surgical history.  Family Psychiatric History: mom : depression  Family History: History reviewed. No pertinent family history.  Social History:   Social History   Socioeconomic History   Marital status: Single    Spouse name: Not on file   Number of children: Not on file   Years of education: Not on file   Highest education level: Not  on file  Occupational History   Not on file  Tobacco Use   Smoking status: Never   Smokeless tobacco: Never  Substance and Sexual Activity   Alcohol use: Yes   Drug use: Yes    Types: Marijuana   Sexual activity: Not Currently  Other Topics Concern   Not on file  Social  History Narrative   Not on file   Social Determinants of Health   Financial Resource Strain: Not on file  Food Insecurity: Patient Unable To Answer (12/25/2022)   Hunger Vital Sign    Worried About Running Out of Food in the Last Year: Patient unable to answer    Ran Out of Food in the Last Year: Patient unable to answer  Transportation Needs: Patient Unable To Answer (12/25/2022)   PRAPARE - Administrator, Civil Service (Medical): Patient unable to answer    Lack of Transportation (Non-Medical): Patient unable to answer  Physical Activity: Not on file  Stress: Not on file  Social Connections: Not on file    Additional Social History: grew up with mom and grand parents, did finish school. Says some challenges at home  Allergies:   Allergies  Allergen Reactions   Tylenol [Acetaminophen] Swelling   Nickel Rash    Metabolic Disorder Labs: Lab Results  Component Value Date   HGBA1C 5.2 12/29/2022   MPG 102.54 12/29/2022   MPG 99.67 02/06/2021   No results found for: "PROLACTIN" Lab Results  Component Value Date   CHOL 160 12/29/2022   TRIG 29 12/29/2022   HDL 64 12/29/2022   CHOLHDL 2.5 12/29/2022   VLDL 6 12/29/2022   LDLCALC 90 12/29/2022   LDLCALC 110 (H) 02/06/2021   Lab Results  Component Value Date   TSH 1.631 12/29/2022    Therapeutic Level Labs: No results found for: "LITHIUM" No results found for: "CBMZ" No results found for: "VALPROATE"  Current Medications: Current Outpatient Medications  Medication Sig Dispense Refill   ARIPiprazole (ABILIFY) 15 MG tablet Take 1 tablet (15 mg total) by mouth daily. 30 tablet 1   FLUoxetine (PROZAC) 20 MG capsule TAKE 1 CAPSULE BY MOUTH EVERY DAY 90 capsule 0   No current facility-administered medications for this visit.     Psychiatric Specialty Exam: Review of Systems  Cardiovascular:  Negative for chest pain.  Neurological:  Negative for tremors.  Psychiatric/Behavioral:  Negative for  hallucinations and self-injury.     There were no vitals taken for this visit.There is no height or weight on file to calculate BMI.  General Appearance: Casual  Eye Contact:  Poor  Speech:  Slow  Volume:  Decreased  Wade:   somewhat subdued  Affect:  Congruent  Thought Process:  Linear  Orientation:  Full (Time, Place, and Person)  Thought Content:  Paranoid Ideation and Rumination  Suicidal Thoughts:  No  Homicidal Thoughts:  No  Memory:  Immediate;   Fair  Judgement:  Other:  shallow  Insight:  Shallow  Psychomotor Activity:  Decreased  Concentration:  Concentration: Fair  Recall:  [employer] of Knowledge:Fair  Language: Fair  Akathisia:  No  Handed:    AIMS (if indicated):  no involuntary movements  Assets:  Desire for Improvement Housing  ADL's:  Intact  Cognition: WNL  Sleep:  Fair   Screenings: AIMS    Flowsheet Row [facility]  AIMS Total Score 0      GAD-7  Flowsheet Row Office Visit from [facility]06/2018 [facility] for [facility] & Adolescent Health Integrated Behavioral Health from 02/19/2017 in [facility] for [facility]  Total GAD-7 Score 9 19      PHQ2-9    Flowsheet Row Video Visit from 01/30/2023 in [facility] [facility] at [facility] Office Visit from 06/06/2018 in [place] [facility] for Child & Adolescent Health Integrated Behavioral Health from 02/19/2017 in [facility] for Child & Adolescent Health  PHQ-2 Total Score 2 2 6   PHQ-9 Total Score 9 5 22       Flowsheet Row ED from [facility]/28/20[facility] in [facility] at [facility] Video Visit from 01/30/2023 in [facility] at [facility] Admission (Discharged) [facility]  C-SSRS  RISK CATEGORY No Risk Error: Question 2 not populated No Risk       Assessment and Plan: as follows  Prior documentation reviewed  Schizoaffective disorder depressed type;  somewhat paranoid but tries not to talk about it. She does not want to increase meds. Compliance discussed will keep aiblify 15mg . I explained if mom can give [place] a call and we can review her condition and meds since she keeps meds  Denies suicidal thoughts   Continue prozac 20mg  for depression  and anxiety  Anxiety unspecified; continue prozac, not on hydroxzyine since admission and OD  Is in therapy with St[facility]hanie [facility], strongly recommend to continue and avoid THC  Addendum: talked to mom, she stated diagnosed with schizoaffive and mom will be giving her meds since [facility] discharge. Abilify 15, prozac 20mg . Patient to engage in therapy   Call earlier for concerns   Compliance discussed.  Prognosis : recurrent admission and high risk. Mom to keep meds and call for questions as well  Will send meds     Insomnia; reviewed sleep hygiene, avoid trazadone since recent OD   Discussed to abstain from any drugs or marijuana and its effect to psychosis and judjement  FU 4 weeks or earlier and also is scheduled for therapy   Collaboration of Care: Psychiatrist AEB hospital discharge and notes reviewed  Patient/Guardian was advised Release of Information must be obtained prior to any record release in order to collaborate their care with an outside provider. Patient/Guardian was advised if they have not already done so to contact the registration department to sign all necessary forms in order for [place] to release information regarding their care.   Consent: Patient/Guardian gives verbal consent for treatment and assignment of benefits for services provided during this visit. Patient/Guardian expressed understanding and agreed to proceed.   Thresa Ross, MD 12/9/20241:43 PM

## 2023-04-25 ENCOUNTER — Other Ambulatory Visit (HOSPITAL_COMMUNITY): Payer: Self-pay | Admitting: Psychiatry

## 2023-05-09 ENCOUNTER — Ambulatory Visit (HOSPITAL_COMMUNITY): Payer: 59 | Admitting: Psychiatry

## 2023-06-25 ENCOUNTER — Other Ambulatory Visit (HOSPITAL_COMMUNITY): Payer: Self-pay | Admitting: Psychiatry

## 2023-06-27 ENCOUNTER — Other Ambulatory Visit (HOSPITAL_COMMUNITY): Payer: Self-pay | Admitting: Psychiatry

## 2023-08-01 ENCOUNTER — Encounter (HOSPITAL_COMMUNITY): Payer: Self-pay | Admitting: Psychiatry

## 2023-08-01 ENCOUNTER — Ambulatory Visit (INDEPENDENT_AMBULATORY_CARE_PROVIDER_SITE_OTHER): Admitting: Psychiatry

## 2023-08-01 VITALS — BP 122/81 | HR 107 | Ht 64.0 in | Wt 137.0 lb

## 2023-08-01 DIAGNOSIS — F250 Schizoaffective disorder, bipolar type: Secondary | ICD-10-CM

## 2023-08-01 NOTE — Progress Notes (Signed)
 [facility] Follow up visit  Patient Identification: Chandrika Sandles MRN:  [id number] Date of Evaluation:  08/01/2023 Referral Source: hospital discharge Chief Complaint:   Chief Complaint  Patient presents with   Follow-up  Follow up hospital discharge Visit Diagnosis:    ICD-10-CM   1. Schizoaffective disorder, bipolar type (HCC)  F25.0        History of Present Illness:  As per last discharge notes "Aryanna Shaver is a 23 y.o. female  with a past psychiatric history of schizoaffective disorder bipolar type, cannabis-induced psychosis. As per previous charts "Patient initially arrived to [facility] on 9/2 for insomnia and trouble recognizing faces, and admitted to [facility] under IVC on 12/25/22 for acute psychosis. PPHx is significant for no history of Suicide Attempts, Self Injurious Behavior, and 2 Prior Psychiatric Hospitalizations (11/2018, [facility] 01/2021). PMHx is significant for eczema."  She has had a recurrent admmission prior last visit and started on abilify, prozac Was supposed to follow up after last visit within a month.  She is coming back after 3 months says not taking meds or nor want to be on.  States her issues is poor sleep at times and not psychosis. Recently self started olanzapine from left over and helps sleep but wants to change to trazadone  She does not want to be on antipsychotic or mood stabilizer Says want trazadone or zoloft, she is dictating what to get and remains non compliant with our recommendations and medications Did not elaborate on [facility] use or if would be concerning  Remains vague of her symptoms or concerns and says can I see someone else who can give me Trazadone or different psychiatrist.  She feels her main concern is sleep but was not able to apprehend that she has had recurrent admissions and multiple psychiatrist have seen her and her diagnosis of schizoaffective Overall denies AH, not sure of paranoia as she remains guarded  Aggravating factors;  recurrent episodes, finances  Modifying factors : mom  Duration more than for 5 years  Severity; recurrent admission, some paranoia   Past Psychiatric History: schizoaffective disorder, brief psychotic disorder  Previous Psychotropic Medications: Yes   Substance Abuse History in the last 12 months:  No.  Consequences of Substance Abuse: NA  Past Medical History:  Past Medical History:  Diagnosis Date   Bipolar affective (HCC)    History reviewed. No pertinent surgical history.  Family Psychiatric History: mom : depression  Family History: History reviewed. No pertinent family history.  Social History:   Social History   Socioeconomic History   Marital status: Single    Spouse name: Not on file   Number of children: Not on file   Years of education: Not on file   Highest education level: Not on file  Occupational History   Not on file  Tobacco Use   Smoking status: Never   Smokeless tobacco: Never  Substance and Sexual Activity   Alcohol use: Yes   Drug use: Yes    Types: Marijuana   Sexual activity: Not Currently  Other Topics Concern   Not on file  Social History Narrative   Not on file   Social Drivers of Health   Financial Resource Strain: Not on file  Food Insecurity: Patient Unable To Answer (12/25/2022)   Hunger Vital Sign    Worried About Running Out of Food in the Last Year: Patient unable to answer    Ran Out of Food in the Last Year: Patient unable to answer  Transportation Needs:  Patient Unable To Answer (12/25/2022)   PRAPARE - Administrator, Civil Service (Medical): Patient unable to answer    Lack of Transportation (Non-Medical): Patient unable to answer  Physical Activity: Not on file  Stress: Not on file  Social Connections: Not on file    Additional Social History: grew up with mom and grand parents, did finish school. Says some challenges at home  Allergies:   Allergies  Allergen Reactions   Tylenol [Acetaminophen]  Swelling   Nickel Rash    Metabolic Disorder Labs: Lab Results  Component Value Date   HGBA1C 5.2 12/29/2022   MPG 102.54 12/29/2022   MPG 99.67 02/06/2021   No results found for: "PROLACTIN" Lab Results  Component Value Date   CHOL 160 12/29/2022   TRIG 29 12/29/2022   HDL 64 12/29/2022   CHOLHDL 2.5 12/29/2022   VLDL 6 12/29/2022   LDLCALC 90 12/29/2022   LDLCALC 110 (H) 02/06/2021   Lab Results  Component Value Date   TSH 1.631 12/29/2022    Therapeutic Level Labs: No results found for: "LITHIUM" No results found for: "CBMZ" No results found for: "VALPROATE"  Current Medications: Current Outpatient Medications  Medication Sig Dispense Refill   ARIPiprazole (ABILIFY) 15 MG tablet TAKE 1 TABLET (15 MG TOTAL) BY MOUTH DAILY. STOP OLANZAPINE 90 tablet 0   FLUoxetine (PROZAC) 20 MG capsule TAKE 1 CAPSULE BY MOUTH EVERY DAY 90 capsule 0   No current facility-administered medications for this visit.     Psychiatric Specialty Exam: Review of Systems  Neurological:  Negative for tremors.  Psychiatric/Behavioral:  Negative for hallucinations and self-injury.     Blood pressure 122/81, pulse (!) 107, height 5\' 4"  (1.626 m), weight 137 lb (62.1 kg).Body mass index is 23.52 kg/m.  General Appearance: Casual  Eye Contact:  Poor  Speech:  Slow  Volume:  Decreased  Mood:   somewhat subdued  Affect:  Congruent  Thought Process:  Linear  Orientation:  Full (Time, Place, and Person)  Thought Content:  Paranoid Ideation and Rumination  Suicidal Thoughts:  No  Homicidal Thoughts:  No  Memory:  Immediate;   Fair  Judgement:  Other:  shallow  Insight:  Shallow  Psychomotor Activity:  Decreased  Concentration:  Concentration: Fair  Recall:  Fair  Fund of Knowledge:Fair  Language: Fair  Akathisia:  No  Handed:    AIMS (if indicated):  no involuntary movements  Assets:  Desire for Improvement Housing  ADL's:  Intact  Cognition: WNL  Sleep:  Fair    Screenings: AIMS    Flowsheet Row [facility]  AIMS Total Score 0      GAD-7    Flowsheet Row Office Visit from [facility]/06/2018 [facility] for Child & Adolescent Health Integrated Behavioral Health from 02/19/2017 in [facility] for [facility] & Adolescent Health  Total GAD-7 Score 9 19      PHQ2-9    Flowsheet Row Video Visit from 01/30/2023 in [facility] at [facility] Office Visit from 06/06/2018 in [place] [facility] for Child & Adolescent Health Integrated Behavioral Health from 02/19/2017 in [facility] for Child & Adolescent Health  PHQ-2 Total Score 2 2 6   PHQ-9 Total Score 9 5 22       Flowsheet Row ED from 02/28/2023 in [facility] Emergency  Department at [facility] Video Visit from 01/30/2023 in [facility] Admission (Discharged) [facility]  C-SSRS RISK CATEGORY No Risk Error: Question 2 not populated No Risk       Assessment and Plan: as follows  Prior documentation reviewed   Schizoaffective disorder depressed type; somewhat guarded paranoid but denies AH Not compliant with our recommendations, past medications Does not want to be on olanzapine, or abilify Says probably she would not take it if it is prescribed Denies suicidal thoughts    Anxiety unspecified; no particular worry described but general worries. Not interested in meds with poor compliance in the past   Compliance discussed.  Prognosis : recurrent admission and high risk.  Non compliant and not agreeable to treatment. Can be discharged. Ill give some time so she can get back to [place]. She feels to look around for another psychiatrist  Insomnia; reviewed sleep hygiene, has  history of OD on trazadone  Risk discussed and to treat the underlying mental health disorder    Discussed to abstain from any drugs or marijuana and its effect to psychosis and judjement  She plans to be discharged or can call if agrees with treatment plan Risk of not being on meds discussed as well.    Collaboration of Care: Psychiatrist AEB hospital discharge and notes reviewed  Patient/Guardian was advised Release of Information must be obtained prior to any record release in order to collaborate their care with an outside provider. Patient/Guardian was advised if they have not already done so to contact the registration department to sign all necessary forms in order for [place] to release information regarding their care.   Consent: Patient/Guardian gives verbal consent for treatment and assignment of benefits for services provided during this visit. Patient/Guardian expressed understanding and agreed to proceed.   Thresa Ross, MD 4/10/202511:37 AM

## 2023-08-06 ENCOUNTER — Telehealth (HOSPITAL_COMMUNITY): Payer: Self-pay | Admitting: Psychiatry

## 2023-08-06 NOTE — Telephone Encounter (Signed)
 Called patient to inquire if she planned to schedule follow up appointment to last week's appointment with provider. No answer and voicemail box has not been set up. Sent patient secure email requesting reply or call back to discuss follow up. Patient does not have an active MyChart account for messaging.

## 2023-08-13 ENCOUNTER — Encounter (HOSPITAL_COMMUNITY): Payer: Self-pay | Admitting: Psychiatry
